# Patient Record
Sex: Female | Born: 1940
Health system: Southern US, Community
[De-identification: ages and names within clinical notes are randomized; demographics above are authoritative.]

## PROBLEM LIST (undated history)

## (undated) DIAGNOSIS — I10 Essential (primary) hypertension: Secondary | ICD-10-CM

## (undated) DIAGNOSIS — E785 Hyperlipidemia, unspecified: Secondary | ICD-10-CM

## (undated) HISTORY — DX: Hyperlipidemia, unspecified: E78.5

## (undated) HISTORY — PX: APPENDECTOMY: SHX54

## (undated) HISTORY — DX: Essential (primary) hypertension: I10

---

## 2002-03-20 ENCOUNTER — Ambulatory Visit (HOSPITAL_COMMUNITY): Admission: RE | Admit: 2002-03-20 | Discharge: 2002-03-20 | Payer: Self-pay | Admitting: Obstetrics and Gynecology

## 2002-03-20 ENCOUNTER — Encounter: Payer: Self-pay | Admitting: Obstetrics and Gynecology

## 2002-04-01 ENCOUNTER — Encounter: Payer: Self-pay | Admitting: Obstetrics and Gynecology

## 2002-04-01 ENCOUNTER — Ambulatory Visit (HOSPITAL_COMMUNITY): Admission: RE | Admit: 2002-04-01 | Discharge: 2002-04-01 | Payer: Self-pay | Admitting: Obstetrics and Gynecology

## 2003-04-15 ENCOUNTER — Encounter: Payer: Self-pay | Admitting: Obstetrics and Gynecology

## 2003-04-15 ENCOUNTER — Ambulatory Visit (HOSPITAL_COMMUNITY): Admission: RE | Admit: 2003-04-15 | Discharge: 2003-04-15 | Payer: Self-pay | Admitting: Obstetrics and Gynecology

## 2007-05-24 ENCOUNTER — Observation Stay (HOSPITAL_COMMUNITY): Admission: RE | Admit: 2007-05-24 | Discharge: 2007-05-25 | Payer: Self-pay | Admitting: Family Medicine

## 2007-05-24 ENCOUNTER — Encounter (INDEPENDENT_AMBULATORY_CARE_PROVIDER_SITE_OTHER): Payer: Self-pay | Admitting: General Surgery

## 2008-11-12 ENCOUNTER — Ambulatory Visit (HOSPITAL_COMMUNITY): Admission: RE | Admit: 2008-11-12 | Discharge: 2008-11-12 | Payer: Self-pay | Admitting: Obstetrics and Gynecology

## 2010-11-22 ENCOUNTER — Ambulatory Visit (HOSPITAL_COMMUNITY)
Admission: RE | Admit: 2010-11-22 | Discharge: 2010-11-22 | Payer: Self-pay | Source: Home / Self Care | Attending: Family Medicine | Admitting: Family Medicine

## 2011-04-19 NOTE — H&P (Signed)
NAMEJAILA, Tina Larson                ACCOUNT NO.:  [id number]   MEDICAL RECORD NO.:  [id number]          PATIENT TYPE:  OBV   LOCATION:  A311                          FACILITY:  [facility]   PHYSICIAN:  Angus G. Renard Matter, MD   DATE OF BIRTH:  26-Sep-1941   DATE OF ADMISSION:  05/24/2007  DATE OF DISCHARGE:  LH                              HISTORY & PHYSICAL   HISTORY OF PRESENT ILLNESS:  This 70 year old, white female was seen in  the office in the late afternoon with a history of lower abdominal pain  which had begun yesterday.  The patient has felt nauseated and anorexic  for most of the day today and has had continuous lower abdominal pain.  The patient was seen and examined and noted to have tenderness over the  right lower quadrant.  She had lab studies done.  A WBC was  11,900 with  hemoglobin 13.9, hematocrit 39.9, neutrophils 72, lymphocytes 19,  monocytes 8, eosinophils 1, absolute neutrophils 8.6.  Chemistries with  sodium 139, potassium 4.6, chloride 102, CO2 28, glucose 117, BUN 8,  creatinine 0.68.  GFR greater than 60.  The patient did have abdominal  CT scan done which showed evidence of acute appendicitis.  The patient  was admitted.   FAMILY HISTORY:  Noncontributory.   SOCIAL HISTORY:  The patient does not smoke or drink alcohol.   PAST MEDICAL/SURGICAL HISTORY:  The patient has had no prior surgery.  She did have two normal deliveries.  She has had no medical problems of  significance.   ALLERGIES:  No known drug allergies.   MEDICATIONS:  None.   REVIEW OF SYSTEMS:  HEENT:  Negative.  CARDIOPULMONARY:  No cough,  hemoptysis or dyspnea.  GI:  The patient has had nausea, but no  diarrhea.  She has had lower abdominal pain mainly in right lower  quadrant.   PHYSICAL EXAMINATION:  GENERAL:  Alert, but uncomfortable, white female.  HEENT:  Negative.  NECK:  Supple.  No JVD or thyroid abnormalities.  BREASTS:  Normal, no masses.  LUNGS:  Clear to P&A.  HEART:  Regular  rhythm no murmurs.  ABDOMEN:  The patient has tenderness with some slight rebound in right  lower quadrant.  No palpable organs or masses.  PELVIC:  Normal size uterus.  No adnexal masses.  External genitalia  normal.  EXTREMITIES:  Free of edema.  NEUROLOGIC:  No focal deficit.   IMPRESSION:  Abdominal pain secondary to acute appendicitis.   PLAN:  The plan is to keep the patient n.p.o. and obtain surgical  consult.  Continue IV fluids.  Will obtain chest x-ray and EKG.      Angus G. Renard Matter, MD  Electronically Signed     AGM/MEDQ  D:  05/24/2007  T:  05/25/2007  Job:  [id number]

## 2011-04-19 NOTE — Op Note (Signed)
[name], Tina Larson                ACCOUNT NO.:  [id number]   MEDICAL RECORD NO.:  [id number]          PATIENT TYPE:  OBV   LOCATION:  A311                          FACILITY:  [facility]   PHYSICIAN:  Dalia Heading, M.D.  DATE OF BIRTH:  [birth year]   DATE OF PROCEDURE:  05/24/2007  DATE OF DISCHARGE:                               OPERATIVE REPORT   PREOPERATIVE DIAGNOSIS:  Acute appendicitis.   POSTOPERATIVE DIAGNOSIS:  Acute appendicitis.   PROCEDURE:  Laparoscopic appendectomy.   SURGEON:  Dalia Heading, M.D.   ANESTHESIA:  General endotracheal.   INDICATIONS:  The patient is a 70 year old white female who presents  with right lower quadrant abdominal pain.  CT scan of the abdomen and  pelvis reveals acute appendicitis.  The risks and benefits of the  procedure including bleeding, infection, and the possibility of an open  procedure were fully explained to the patient who gave informed consent.   PROCEDURE NOTE:  The patient was placed in the supine position.  After  induction of general endotracheal anesthesia, the abdomen was prepped  and draped using the usual sterile technique with Betadine.  Surgical  site confirmation was performed.   A supraumbilical incision was made down to the fascia.  A Veress needle  was introduced into the abdominal cavity, and confirmation of placement  was done using the saline drop test.  The abdomen was then insufflated  to 16 mmHg pressure.  An 11-mm trocar was introduced into the abdominal  cavity under direct visualization with difficulty.  The patient was  placed in deeper Trendelenburg position, and an additional 12-mm trocar  was placed in the suprapubic region and a 5-mm trocar was placed in the  left lower quadrant region.  The appendix was visualized and noted to be  acutely inflamed.  The mesoappendix was divided using the harmonic  scalpel.  A vascular Endo-GIA was placed across the base of the appendix  and fired.  The appendix  was removed using an EndoCatch bag.  The right  lower quadrant was inspected.  No abnormal bleeding or spillage was  noted.  The staple line was noted to be intact.  All fluid and air were  then evacuated from the abdominal cavity prior to removal of the  trocars.   All wounds were irrigated with normal saline.  All wounds were injected  with 0.5% Sensorcaine.  The supraumbilical fascia as well as suprapubic  fascia were reapproximated using 0 Vicryl interrupted sutures.  All skin  incisions were closed using staples.  Betadine ointment and dry sterile  dressings were applied.   All tape and needle counts were correct at the end of the procedure.  The patient was extubated in the operating room and went back to the  recovery room awake and in stable condition.   COMPLICATIONS:  None.   SPECIMEN:  Appendix.   BLOOD LOSS:  Minimal.      Dalia Heading, M.D.  Electronically Signed     MAJ/MEDQ  D:  05/24/2007  T:  05/25/2007  Job:  [id number]   cc:  Angus G. Renard Matter, MD  Fax: [phone]

## 2011-09-21 LAB — BASIC METABOLIC PANEL
BUN: 8
CO2: 28
Chloride: 102
Creatinine, Ser: 0.68
Potassium: 4.6

## 2011-09-21 LAB — DIFFERENTIAL
Basophils Relative: 0
Eosinophils Absolute: 0.1
Eosinophils Relative: 1
Monocytes Relative: 8
Neutrophils Relative %: 72

## 2011-09-21 LAB — CBC
HCT: 39.9
MCHC: 34.9
MCV: 91.1
Platelets: 238
RBC: 4.38

## 2013-11-29 ENCOUNTER — Emergency Department (HOSPITAL_COMMUNITY): Payer: Medicare Other

## 2013-11-29 ENCOUNTER — Emergency Department (HOSPITAL_COMMUNITY)
Admission: EM | Admit: 2013-11-29 | Discharge: 2013-11-29 | Disposition: A | Discharge status: Home / Self Care | Payer: Medicare Other | Attending: Emergency Medicine | Admitting: Emergency Medicine

## 2013-11-29 ENCOUNTER — Encounter (HOSPITAL_COMMUNITY): Payer: Self-pay | Admitting: Emergency Medicine

## 2013-11-29 DIAGNOSIS — W010XXA Fall on same level from slipping, tripping and stumbling without subsequent striking against object, initial encounter: Secondary | ICD-10-CM | POA: Insufficient documentation

## 2013-11-29 DIAGNOSIS — S82899A Other fracture of unspecified lower leg, initial encounter for closed fracture: Secondary | ICD-10-CM | POA: Insufficient documentation

## 2013-11-29 DIAGNOSIS — Y939 Activity, unspecified: Secondary | ICD-10-CM | POA: Insufficient documentation

## 2013-11-29 DIAGNOSIS — Y929 Unspecified place or not applicable: Secondary | ICD-10-CM | POA: Insufficient documentation

## 2013-11-29 DIAGNOSIS — S82401A Unspecified fracture of shaft of right fibula, initial encounter for closed fracture: Secondary | ICD-10-CM

## 2013-11-29 MED ORDER — HYDROCODONE-ACETAMINOPHEN 5-325 MG PO TABS: 1.0000 | ORAL_TABLET | Freq: Four times a day (QID) | ORAL | Status: DC | PRN

## 2013-11-29 NOTE — ED Notes (Signed)
Left foot slipped but right foot slide under pt per pt on 12/24 night, able to put weight on it, using a cane

## 2013-11-29 NOTE — ED Provider Notes (Signed)
CSN: [id number]     Arrival date & time 11/29/13  1632 History   First MD Initiated Contact with Patient 11/29/13 1723     Chief Complaint  Patient presents with  . Ankle Pain   (Consider location/radiation/quality/duration/timing/severity/associated sxs/prior Treatment) HPI Comments: Patient presents to the emergency department with chief complaint of right ankle pain. She states that she suffered a mechanical fall on Christmas Eve. She denies hitting her head or losing consciousness. She states that she slipped on wet ground. She states that after she fell, she noticed pain in her right ankle. She states that it has swollen. She tried taking some Tylenol with some relief. She is tried using a walker, and is able to ambulate with assistance. The pain is mild. She describes the pain as a burning sensation on the lateral side of her right ankle.  The history is provided by the patient. No language interpreter was used.    History reviewed. No pertinent past medical history. Past Surgical History  Procedure Laterality Date  . Appendectomy     History reviewed. No pertinent family history. History  Substance Use Topics  . Smoking status: Never Smoker   . Smokeless tobacco: Not on file  . Alcohol Use: No   OB History   Grav Para Term Preterm Abortions TAB SAB Ect Mult Living                 Review of Systems  All other systems reviewed and are negative.    Allergies  Review of patient's allergies indicates no known allergies.  Home Medications   Current Outpatient Rx  Name  Route  Sig  Dispense  Refill  . HYDROcodone-acetaminophen (NORCO/VICODIN) 5-325 MG per tablet   Oral   Take 1 tablet by mouth every 6 (six) hours as needed.   15 tablet   0    BP 157/51  Pulse 84  Temp(Src) 97.6 F (36.4 C) (Oral)  Resp 18  Ht 5\' 4"  (1.626 m)  Wt 185 lb (83.915 kg)  BMI 31.74 kg/m2  SpO2 100% Physical Exam  Nursing note and vitals reviewed. Constitutional: She is oriented  to person, place, and time. She appears well-developed and well-nourished.  HENT:  Head: Normocephalic and atraumatic.  Eyes: Conjunctivae and EOM are normal.  Neck: Normal range of motion.  Cardiovascular: Normal rate.   Intact distal pulses  Pulmonary/Chest: Effort normal.  Abdominal: She exhibits no distension.  Musculoskeletal: Normal range of motion.  Right ankle range of motion and strength reduced secondary to pain, it is specifically worsened with plantar flexion, no obvious bony abnormality or deformity, there is a moderate amount of swelling  Neurological: She is alert and oriented to person, place, and time.  Sensation intact  Skin: Skin is dry.  Brisk capillary refill  Psychiatric: She has a normal mood and affect. Her behavior is normal. Judgment and thought content normal.    ED Course  Procedures (including critical care time) Labs Review Labs Reviewed - No data to display Imaging Review Dg Ankle Complete Right  11/29/2013   CLINICAL DATA:  Status post fall.  Right ankle pain and swelling.  EXAM: RIGHT ANKLE - COMPLETE 3+ VIEW  COMPARISON:  None.  FINDINGS: The patient has a nondisplaced fracture of the distal fibula with associated soft tissue swelling. No other acute bony or joint abnormality is identified. Plantar calcaneal spur is noted.  IMPRESSION: Nondisplaced distal fibular fracture.   Electronically Signed   By: Drusilla Kanner M.D.  On: 11/29/2013 17:06    EKG Interpretation   None       MDM   1. Fibula fracture, right, closed, initial encounter    Patient was fibular fracture. It is nondisplaced. She has been weightbearing. Will give the patient a Cam Walker. Recommend orthopedic followup. Will also give some crutches, and pain medicine. Return precautions are given. Rice therapy is advised. Patient understands and agrees with plan. She is stable and ready for discharge.    Roxy Horseman, PA-C 11/29/13 1742

## 2013-11-29 NOTE — ED Provider Notes (Signed)
Medical screening examination/treatment/procedure(s) were performed by non-physician practitioner and as supervising physician I was immediately available for consultation/collaboration.  EKG Interpretation   None         Vaanya Shambaugh L Sofie Schendel, MD 11/29/13 2016 

## 2013-12-03 ENCOUNTER — Encounter: Payer: Self-pay | Admitting: Orthopedic Surgery

## 2013-12-03 ENCOUNTER — Ambulatory Visit (INDEPENDENT_AMBULATORY_CARE_PROVIDER_SITE_OTHER): Payer: Medicare Other | Admitting: Orthopedic Surgery

## 2013-12-03 VITALS — BP 163/88 | Ht 64.0 in | Wt 185.0 lb

## 2013-12-03 DIAGNOSIS — S82891A Other fracture of right lower leg, initial encounter for closed fracture: Secondary | ICD-10-CM

## 2013-12-03 DIAGNOSIS — S82899A Other fracture of unspecified lower leg, initial encounter for closed fracture: Secondary | ICD-10-CM | POA: Insufficient documentation

## 2013-12-03 NOTE — Patient Instructions (Signed)
Do ankle exercises

## 2013-12-03 NOTE — Progress Notes (Signed)
   Subjective:    Patient ID: Tina Larson, female    DOB: [birth year], 72 y.o.   MRN: [id number] Chief Complaint  Patient presents with  . Foot Pain    Right Fibula fracture d/t injury 11/27/13    Foot Pain   72 year old female presents with history of fall on December 24 injuring her right fibula she complains of pain along the lateral aspect of the right ankle with swelling and stiffness and ecchymosis under the skin, painful weightbearing  Review of systems as recorded    Review of Systems She reported document reviewed today on December 30th    Objective:   Physical Exam BP 163/88  Ht 5\' 4"  (1.626 m)  Wt 185 lb (83.915 kg)  BMI 31.74 kg/m2 General appearance is normal, the patient is alert and oriented x3 with normal mood and affect. Ambulation is limited by Cam Walker and crutches, not doing well with the crutches  Tenderness over the fibula skin is ecchymotic motor exam normal muscle tone ankle is stable range of motion painful limited but tolerable to 90 of ankle position pulses intact sensation is normal. There is no lymphadenopathy.       Assessment & Plan:  Fibular fracture nondisplaced ankle mortise intact on x-ray  Wear brace for 8 weeks  Returns 7 weeks for x-ray

## 2014-01-21 ENCOUNTER — Ambulatory Visit: Payer: Medicare Other | Admitting: Orthopedic Surgery

## 2014-01-23 ENCOUNTER — Ambulatory Visit (INDEPENDENT_AMBULATORY_CARE_PROVIDER_SITE_OTHER): Payer: Medicare Other | Admitting: Orthopedic Surgery

## 2014-01-23 ENCOUNTER — Ambulatory Visit (INDEPENDENT_AMBULATORY_CARE_PROVIDER_SITE_OTHER): Payer: Medicare Other

## 2014-01-23 ENCOUNTER — Encounter: Payer: Self-pay | Admitting: Orthopedic Surgery

## 2014-01-23 VITALS — BP 155/80 | Ht 64.0 in | Wt 185.0 lb

## 2014-01-23 DIAGNOSIS — S82899A Other fracture of unspecified lower leg, initial encounter for closed fracture: Secondary | ICD-10-CM

## 2014-01-23 DIAGNOSIS — S82891A Other fracture of right lower leg, initial encounter for closed fracture: Secondary | ICD-10-CM

## 2014-01-23 NOTE — Progress Notes (Signed)
Patient ID: Tina Larson, female   DOB: Apr 08, 1941, 73 y.o.   MRN: [id number]  Encounter Diagnosis  Name Primary?  Marland Kitchen. Ankle fracture, right Yes    Chief Complaint  Patient presents with  . Follow-up    7 week recheck right ankle FIB fracture DOI 11/27/13    BP 155/80  Ht 5\' 4"  (1.626 m)  Wt 185 lb (83.915 kg)  BMI 31.74 kg/m2  Status post Cam Walker for fibular fracture she is now 8 weeks out. She complains of swelling and some burning along the fracture and fibula  The ankle looks good her range of motion is actually excellent she has minimal tenderness at the fracture site and the x-ray shows that the fracture is essentially healed  Recommend progressive weaning from the brace progressive increase in activity as tolerated followup as needed

## 2014-06-23 ENCOUNTER — Other Ambulatory Visit (HOSPITAL_COMMUNITY): Payer: Self-pay | Admitting: Family Medicine

## 2014-06-23 DIAGNOSIS — M810 Age-related osteoporosis without current pathological fracture: Secondary | ICD-10-CM

## 2014-06-25 ENCOUNTER — Ambulatory Visit (HOSPITAL_COMMUNITY)
Admission: RE | Admit: 2014-06-25 | Discharge: 2014-06-25 | Disposition: A | Discharge status: Home / Self Care | Payer: Medicare Other | Source: Ambulatory Visit | Attending: Family Medicine | Admitting: Family Medicine

## 2014-06-25 DIAGNOSIS — M818 Other osteoporosis without current pathological fracture: Secondary | ICD-10-CM | POA: Insufficient documentation

## 2014-06-25 DIAGNOSIS — M810 Age-related osteoporosis without current pathological fracture: Secondary | ICD-10-CM

## 2016-02-18 ENCOUNTER — Emergency Department (HOSPITAL_COMMUNITY): Payer: Medicare Other

## 2016-02-18 ENCOUNTER — Emergency Department (HOSPITAL_COMMUNITY)
Admission: EM | Admit: 2016-02-18 | Discharge: 2016-02-18 | Disposition: A | Discharge status: Home / Self Care | Payer: Medicare Other | Attending: Emergency Medicine | Admitting: Emergency Medicine

## 2016-02-18 ENCOUNTER — Encounter (HOSPITAL_COMMUNITY): Payer: Self-pay | Admitting: Emergency Medicine

## 2016-02-18 DIAGNOSIS — R6889 Other general symptoms and signs: Secondary | ICD-10-CM

## 2016-02-18 DIAGNOSIS — J111 Influenza due to unidentified influenza virus with other respiratory manifestations: Secondary | ICD-10-CM | POA: Diagnosis not present

## 2016-02-18 DIAGNOSIS — R05 Cough: Secondary | ICD-10-CM | POA: Diagnosis present

## 2016-02-18 NOTE — ED Notes (Signed)
Pt states she has had a bad cough, generalized weakness, and headache since last Thursday.

## 2016-02-18 NOTE — Discharge Instructions (Signed)
Continue the cough medicine that the pharmacist gave you. Return for any new or worse symptoms. Symptoms seem to be consistent with a flulike illness. Chest x-ray was negative for pneumonia.

## 2016-02-18 NOTE — ED Provider Notes (Signed)
CSN: [id number]     Arrival date & time 02/18/16  1553 History   First MD Initiated Contact with Patient 02/18/16 1705     Chief Complaint  Patient presents with  . Cough     (Consider location/radiation/quality/duration/timing/severity/associated sxs/prior Treatment) Patient is a 75 y.o. female presenting with cough. The history is provided by the patient.  Cough Associated symptoms: chills, fever, headaches and myalgias   Associated symptoms: no chest pain and no rash    patient with onset of flulike symptoms one week ago. Associated with fever chills cough is nonproductive bodyaches headache yesterday that resolved. Fatigue generalized weakness. Patient's been taking cough medicine prescribed by her pharmacist. Her husband has a similar illness that started a few days later. Overall patient feels better the last couple days.  History reviewed. No pertinent past medical history. Past Surgical History  Procedure Laterality Date  . Appendectomy     History reviewed. No pertinent family history. Social History  Substance Use Topics  . Smoking status: Never Smoker   . Smokeless tobacco: None  . Alcohol Use: No   OB History    No data available     Review of Systems  Constitutional: Positive for fever and chills.  HENT: Positive for congestion.   Eyes: Negative for visual disturbance.  Respiratory: Positive for cough.   Cardiovascular: Negative for chest pain.  Gastrointestinal: Negative for nausea, vomiting, abdominal pain and diarrhea.  Genitourinary: Negative for dysuria.  Musculoskeletal: Positive for myalgias.  Skin: Negative for rash.  Neurological: Positive for headaches.  Hematological: Does not bruise/bleed easily.  Psychiatric/Behavioral: Negative for confusion.      Allergies  Review of patient's allergies indicates no known allergies.  Home Medications   Prior to Admission medications   Medication Sig Start Date End Date Taking? Authorizing Provider   HYDROcodone-acetaminophen (NORCO/VICODIN) 5-325 MG per tablet Take 1 tablet by mouth every 6 (six) hours as needed. 11/29/13   Roxy Horsemanobert Browning, PA-C   BP 180/62 mmHg  Pulse 66  Temp(Src) 98.5 F (36.9 C) (Temporal)  Resp 18  Ht 5\' 4"  (1.626 m)  Wt 81.647 kg  BMI 30.88 kg/m2  SpO2 100% Physical Exam  Constitutional: She is oriented to person, place, and time. She appears well-developed and well-nourished. No distress.  HENT:  Head: Normocephalic and atraumatic.  Mouth/Throat: Oropharynx is clear and moist.  Eyes: Conjunctivae and EOM are normal. Pupils are equal, round, and reactive to light.  Neck: Normal range of motion. Neck supple.  Cardiovascular: Normal rate, regular rhythm and normal heart sounds.   No murmur heard. Pulmonary/Chest: Effort normal and breath sounds normal. No respiratory distress. She has no wheezes. She has no rales.  Abdominal: Soft. There is no tenderness.  Musculoskeletal: Normal range of motion. She exhibits no edema.  Neurological: She is alert and oriented to person, place, and time. No cranial nerve deficit. She exhibits normal muscle tone. Coordination normal.  Skin: Skin is warm. No rash noted.  Nursing note and vitals reviewed.   ED Course  Procedures (including critical care time) Labs Review Labs Reviewed - No data to display  Imaging Review Dg Chest 2 View  02/18/2016  CLINICAL DATA:  Dry cough and fever since Thursday, nonsmoker EXAM: CHEST  2 VIEW COMPARISON:  05/24/2007 FINDINGS: Mild enlargement of cardiac silhouette. Atherosclerotic calcification aorta. Mediastinal contours and pulmonary vascularity normal. Bronchitic changes without infiltrate, pleural effusion, or pneumothorax. Bones appear diffusely demineralized. IMPRESSION: Mild enlargement of cardiac silhouette. Bronchitic changes without infiltrate. Electronically Signed  By: Ulyses Southward M.D.   On: 02/18/2016 16:26   I have personally reviewed and evaluated these images and lab  results as part of my medical decision-making.   EKG Interpretation None      MDM   Final diagnoses:  Flu-like symptoms    Patient with one-week history of flulike illness. Patient overall improving. Still with cough nonproductive. Still with fatigue. Headache yesterday but resolved today. Patient's husband with similar illness that started a few days later for him. Patient is taking cough medicine provided by the pharmacist. It is helping with the cough at night. Patient nontoxic no acute distress. Chest x-ray negative for pneumonia.  Oxygen saturation on room air is 100%.  Vanetta Mulders, MD 02/18/16 1753

## 2016-10-03 ENCOUNTER — Other Ambulatory Visit (HOSPITAL_COMMUNITY): Payer: Self-pay | Admitting: Internal Medicine

## 2016-10-03 DIAGNOSIS — Z780 Asymptomatic menopausal state: Secondary | ICD-10-CM

## 2016-10-12 ENCOUNTER — Other Ambulatory Visit (HOSPITAL_COMMUNITY): Payer: Self-pay | Admitting: Internal Medicine

## 2016-10-12 DIAGNOSIS — Z1231 Encounter for screening mammogram for malignant neoplasm of breast: Secondary | ICD-10-CM

## 2016-10-19 ENCOUNTER — Other Ambulatory Visit (HOSPITAL_COMMUNITY): Payer: Medicare Other

## 2016-11-09 ENCOUNTER — Ambulatory Visit (HOSPITAL_COMMUNITY)
Admission: RE | Admit: 2016-11-09 | Discharge: 2016-11-09 | Disposition: A | Discharge status: Home / Self Care | Payer: Medicare Other | Source: Ambulatory Visit | Attending: Internal Medicine | Admitting: Internal Medicine

## 2016-11-09 DIAGNOSIS — M85851 Other specified disorders of bone density and structure, right thigh: Secondary | ICD-10-CM | POA: Insufficient documentation

## 2016-11-09 DIAGNOSIS — Z780 Asymptomatic menopausal state: Secondary | ICD-10-CM | POA: Insufficient documentation

## 2016-11-09 DIAGNOSIS — Z1231 Encounter for screening mammogram for malignant neoplasm of breast: Secondary | ICD-10-CM

## 2016-11-09 DIAGNOSIS — M8588 Other specified disorders of bone density and structure, other site: Secondary | ICD-10-CM | POA: Insufficient documentation

## 2016-12-07 DIAGNOSIS — Z0000 Encounter for general adult medical examination without abnormal findings: Secondary | ICD-10-CM | POA: Diagnosis not present

## 2016-12-07 DIAGNOSIS — Z23 Encounter for immunization: Secondary | ICD-10-CM | POA: Diagnosis not present

## 2017-02-02 DIAGNOSIS — R7301 Impaired fasting glucose: Secondary | ICD-10-CM | POA: Diagnosis not present

## 2017-02-02 DIAGNOSIS — E782 Mixed hyperlipidemia: Secondary | ICD-10-CM | POA: Diagnosis not present

## 2017-02-06 DIAGNOSIS — Z0000 Encounter for general adult medical examination without abnormal findings: Secondary | ICD-10-CM | POA: Diagnosis not present

## 2017-02-06 DIAGNOSIS — E782 Mixed hyperlipidemia: Secondary | ICD-10-CM | POA: Diagnosis not present

## 2017-02-06 DIAGNOSIS — Z23 Encounter for immunization: Secondary | ICD-10-CM | POA: Diagnosis not present

## 2017-02-06 DIAGNOSIS — R7301 Impaired fasting glucose: Secondary | ICD-10-CM | POA: Diagnosis not present

## 2017-04-18 DIAGNOSIS — R079 Chest pain, unspecified: Secondary | ICD-10-CM | POA: Diagnosis not present

## 2017-04-18 DIAGNOSIS — R42 Dizziness and giddiness: Secondary | ICD-10-CM | POA: Diagnosis not present

## 2017-04-26 DIAGNOSIS — R42 Dizziness and giddiness: Secondary | ICD-10-CM | POA: Diagnosis not present

## 2017-04-26 DIAGNOSIS — R079 Chest pain, unspecified: Secondary | ICD-10-CM | POA: Diagnosis not present

## 2017-06-14 DIAGNOSIS — E782 Mixed hyperlipidemia: Secondary | ICD-10-CM | POA: Diagnosis not present

## 2017-06-14 DIAGNOSIS — R7301 Impaired fasting glucose: Secondary | ICD-10-CM | POA: Diagnosis not present

## 2017-06-19 DIAGNOSIS — E782 Mixed hyperlipidemia: Secondary | ICD-10-CM | POA: Diagnosis not present

## 2017-06-19 DIAGNOSIS — I10 Essential (primary) hypertension: Secondary | ICD-10-CM | POA: Diagnosis not present

## 2017-06-19 DIAGNOSIS — R7301 Impaired fasting glucose: Secondary | ICD-10-CM | POA: Diagnosis not present

## 2017-10-11 DIAGNOSIS — Z23 Encounter for immunization: Secondary | ICD-10-CM | POA: Diagnosis not present

## 2017-12-18 DIAGNOSIS — I10 Essential (primary) hypertension: Secondary | ICD-10-CM | POA: Diagnosis not present

## 2017-12-18 DIAGNOSIS — Z23 Encounter for immunization: Secondary | ICD-10-CM | POA: Diagnosis not present

## 2017-12-27 DIAGNOSIS — R7301 Impaired fasting glucose: Secondary | ICD-10-CM | POA: Diagnosis not present

## 2018-05-07 DIAGNOSIS — H5211 Myopia, right eye: Secondary | ICD-10-CM | POA: Diagnosis not present

## 2018-06-14 DIAGNOSIS — E782 Mixed hyperlipidemia: Secondary | ICD-10-CM | POA: Diagnosis not present

## 2018-06-14 DIAGNOSIS — Z23 Encounter for immunization: Secondary | ICD-10-CM | POA: Diagnosis not present

## 2018-06-14 DIAGNOSIS — I10 Essential (primary) hypertension: Secondary | ICD-10-CM | POA: Diagnosis not present

## 2018-06-14 DIAGNOSIS — R7301 Impaired fasting glucose: Secondary | ICD-10-CM | POA: Diagnosis not present

## 2018-06-20 ENCOUNTER — Other Ambulatory Visit: Payer: Self-pay | Admitting: Internal Medicine

## 2018-06-20 DIAGNOSIS — E1169 Type 2 diabetes mellitus with other specified complication: Secondary | ICD-10-CM | POA: Diagnosis not present

## 2018-06-20 DIAGNOSIS — Z0000 Encounter for general adult medical examination without abnormal findings: Secondary | ICD-10-CM | POA: Diagnosis not present

## 2018-06-20 DIAGNOSIS — H6120 Impacted cerumen, unspecified ear: Secondary | ICD-10-CM | POA: Diagnosis not present

## 2018-06-20 DIAGNOSIS — Z780 Asymptomatic menopausal state: Secondary | ICD-10-CM

## 2018-06-20 DIAGNOSIS — E782 Mixed hyperlipidemia: Secondary | ICD-10-CM | POA: Diagnosis not present

## 2018-06-20 DIAGNOSIS — I10 Essential (primary) hypertension: Secondary | ICD-10-CM | POA: Diagnosis not present

## 2018-07-16 ENCOUNTER — Other Ambulatory Visit (HOSPITAL_COMMUNITY): Payer: Self-pay | Admitting: Adult Health Nurse Practitioner

## 2018-07-16 ENCOUNTER — Ambulatory Visit (HOSPITAL_COMMUNITY)
Admission: RE | Admit: 2018-07-16 | Discharge: 2018-07-16 | Disposition: A | Discharge status: Home / Self Care | Payer: Medicare Other | Source: Ambulatory Visit | Attending: Adult Health Nurse Practitioner | Admitting: Adult Health Nurse Practitioner

## 2018-07-16 DIAGNOSIS — W19XXXA Unspecified fall, initial encounter: Secondary | ICD-10-CM | POA: Diagnosis not present

## 2018-07-16 DIAGNOSIS — M25571 Pain in right ankle and joints of right foot: Secondary | ICD-10-CM

## 2018-07-16 DIAGNOSIS — S9001XA Contusion of right ankle, initial encounter: Secondary | ICD-10-CM | POA: Diagnosis not present

## 2018-08-29 DIAGNOSIS — T7840XA Allergy, unspecified, initial encounter: Secondary | ICD-10-CM | POA: Diagnosis not present

## 2018-08-29 DIAGNOSIS — R220 Localized swelling, mass and lump, head: Secondary | ICD-10-CM | POA: Diagnosis not present

## 2018-08-29 DIAGNOSIS — L500 Allergic urticaria: Secondary | ICD-10-CM | POA: Diagnosis not present

## 2018-08-29 DIAGNOSIS — R0602 Shortness of breath: Secondary | ICD-10-CM | POA: Diagnosis not present

## 2018-08-29 DIAGNOSIS — R9431 Abnormal electrocardiogram [ECG] [EKG]: Secondary | ICD-10-CM | POA: Diagnosis not present

## 2018-09-01 DIAGNOSIS — L232 Allergic contact dermatitis due to cosmetics: Secondary | ICD-10-CM | POA: Diagnosis not present

## 2018-09-01 DIAGNOSIS — Z91013 Allergy to seafood: Secondary | ICD-10-CM | POA: Diagnosis not present

## 2018-09-25 DIAGNOSIS — R51 Headache: Secondary | ICD-10-CM | POA: Diagnosis not present

## 2018-09-25 DIAGNOSIS — I10 Essential (primary) hypertension: Secondary | ICD-10-CM | POA: Diagnosis not present

## 2018-09-25 DIAGNOSIS — Z882 Allergy status to sulfonamides status: Secondary | ICD-10-CM | POA: Diagnosis not present

## 2018-09-25 DIAGNOSIS — W01198A Fall on same level from slipping, tripping and stumbling with subsequent striking against other object, initial encounter: Secondary | ICD-10-CM | POA: Diagnosis not present

## 2018-09-25 DIAGNOSIS — Z79899 Other long term (current) drug therapy: Secondary | ICD-10-CM | POA: Diagnosis not present

## 2018-09-25 DIAGNOSIS — S060X0A Concussion without loss of consciousness, initial encounter: Secondary | ICD-10-CM | POA: Diagnosis not present

## 2018-09-25 DIAGNOSIS — S0003XA Contusion of scalp, initial encounter: Secondary | ICD-10-CM | POA: Diagnosis not present

## 2019-01-21 DIAGNOSIS — R7301 Impaired fasting glucose: Secondary | ICD-10-CM | POA: Diagnosis not present

## 2019-01-21 DIAGNOSIS — I10 Essential (primary) hypertension: Secondary | ICD-10-CM | POA: Diagnosis not present

## 2019-01-21 DIAGNOSIS — E1169 Type 2 diabetes mellitus with other specified complication: Secondary | ICD-10-CM | POA: Diagnosis not present

## 2019-01-21 DIAGNOSIS — E782 Mixed hyperlipidemia: Secondary | ICD-10-CM | POA: Diagnosis not present

## 2019-01-23 DIAGNOSIS — M25511 Pain in right shoulder: Secondary | ICD-10-CM | POA: Diagnosis not present

## 2019-01-23 DIAGNOSIS — E1169 Type 2 diabetes mellitus with other specified complication: Secondary | ICD-10-CM | POA: Diagnosis not present

## 2019-01-23 DIAGNOSIS — I10 Essential (primary) hypertension: Secondary | ICD-10-CM | POA: Diagnosis not present

## 2019-01-23 DIAGNOSIS — E782 Mixed hyperlipidemia: Secondary | ICD-10-CM | POA: Diagnosis not present

## 2019-02-12 DIAGNOSIS — R7301 Impaired fasting glucose: Secondary | ICD-10-CM | POA: Diagnosis not present

## 2019-02-12 DIAGNOSIS — E782 Mixed hyperlipidemia: Secondary | ICD-10-CM | POA: Diagnosis not present

## 2019-02-12 DIAGNOSIS — I10 Essential (primary) hypertension: Secondary | ICD-10-CM | POA: Diagnosis not present

## 2019-03-11 DIAGNOSIS — R7301 Impaired fasting glucose: Secondary | ICD-10-CM | POA: Diagnosis not present

## 2019-03-11 DIAGNOSIS — I10 Essential (primary) hypertension: Secondary | ICD-10-CM | POA: Diagnosis not present

## 2019-03-11 DIAGNOSIS — E782 Mixed hyperlipidemia: Secondary | ICD-10-CM | POA: Diagnosis not present

## 2019-04-08 DIAGNOSIS — Z0000 Encounter for general adult medical examination without abnormal findings: Secondary | ICD-10-CM | POA: Diagnosis not present

## 2019-05-14 DIAGNOSIS — R7301 Impaired fasting glucose: Secondary | ICD-10-CM | POA: Diagnosis not present

## 2019-05-14 DIAGNOSIS — E782 Mixed hyperlipidemia: Secondary | ICD-10-CM | POA: Diagnosis not present

## 2019-05-14 DIAGNOSIS — I10 Essential (primary) hypertension: Secondary | ICD-10-CM | POA: Diagnosis not present

## 2019-07-12 DIAGNOSIS — R7301 Impaired fasting glucose: Secondary | ICD-10-CM | POA: Diagnosis not present

## 2019-07-12 DIAGNOSIS — I10 Essential (primary) hypertension: Secondary | ICD-10-CM | POA: Diagnosis not present

## 2019-07-12 DIAGNOSIS — E782 Mixed hyperlipidemia: Secondary | ICD-10-CM | POA: Diagnosis not present

## 2019-07-22 DIAGNOSIS — E1169 Type 2 diabetes mellitus with other specified complication: Secondary | ICD-10-CM | POA: Diagnosis not present

## 2019-07-22 DIAGNOSIS — E782 Mixed hyperlipidemia: Secondary | ICD-10-CM | POA: Diagnosis not present

## 2019-07-22 DIAGNOSIS — I10 Essential (primary) hypertension: Secondary | ICD-10-CM | POA: Diagnosis not present

## 2019-07-22 DIAGNOSIS — R7301 Impaired fasting glucose: Secondary | ICD-10-CM | POA: Diagnosis not present

## 2019-07-24 DIAGNOSIS — E782 Mixed hyperlipidemia: Secondary | ICD-10-CM | POA: Diagnosis not present

## 2019-07-24 DIAGNOSIS — I10 Essential (primary) hypertension: Secondary | ICD-10-CM | POA: Diagnosis not present

## 2019-07-24 DIAGNOSIS — E1169 Type 2 diabetes mellitus with other specified complication: Secondary | ICD-10-CM | POA: Diagnosis not present

## 2019-08-09 DIAGNOSIS — R7301 Impaired fasting glucose: Secondary | ICD-10-CM | POA: Diagnosis not present

## 2019-08-09 DIAGNOSIS — I10 Essential (primary) hypertension: Secondary | ICD-10-CM | POA: Diagnosis not present

## 2019-08-09 DIAGNOSIS — E782 Mixed hyperlipidemia: Secondary | ICD-10-CM | POA: Diagnosis not present

## 2019-08-22 ENCOUNTER — Other Ambulatory Visit (HOSPITAL_COMMUNITY): Payer: Self-pay | Admitting: Internal Medicine

## 2019-08-22 DIAGNOSIS — Z1231 Encounter for screening mammogram for malignant neoplasm of breast: Secondary | ICD-10-CM

## 2019-09-12 DIAGNOSIS — E782 Mixed hyperlipidemia: Secondary | ICD-10-CM | POA: Diagnosis not present

## 2019-09-12 DIAGNOSIS — I10 Essential (primary) hypertension: Secondary | ICD-10-CM | POA: Diagnosis not present

## 2019-09-12 DIAGNOSIS — R7301 Impaired fasting glucose: Secondary | ICD-10-CM | POA: Diagnosis not present

## 2019-09-18 DIAGNOSIS — W01198A Fall on same level from slipping, tripping and stumbling with subsequent striking against other object, initial encounter: Secondary | ICD-10-CM | POA: Diagnosis not present

## 2019-09-18 DIAGNOSIS — S0993XA Unspecified injury of face, initial encounter: Secondary | ICD-10-CM | POA: Diagnosis not present

## 2019-09-18 DIAGNOSIS — S199XXA Unspecified injury of neck, initial encounter: Secondary | ICD-10-CM | POA: Diagnosis not present

## 2019-09-18 DIAGNOSIS — R001 Bradycardia, unspecified: Secondary | ICD-10-CM | POA: Diagnosis not present

## 2019-09-18 DIAGNOSIS — S0990XA Unspecified injury of head, initial encounter: Secondary | ICD-10-CM | POA: Diagnosis not present

## 2019-09-18 DIAGNOSIS — Z91013 Allergy to seafood: Secondary | ICD-10-CM | POA: Diagnosis not present

## 2019-09-18 DIAGNOSIS — M79645 Pain in left finger(s): Secondary | ICD-10-CM | POA: Diagnosis not present

## 2019-09-18 DIAGNOSIS — I10 Essential (primary) hypertension: Secondary | ICD-10-CM | POA: Diagnosis not present

## 2019-09-18 DIAGNOSIS — R42 Dizziness and giddiness: Secondary | ICD-10-CM | POA: Diagnosis not present

## 2019-09-18 DIAGNOSIS — S6992XA Unspecified injury of left wrist, hand and finger(s), initial encounter: Secondary | ICD-10-CM | POA: Diagnosis not present

## 2019-09-18 DIAGNOSIS — R55 Syncope and collapse: Secondary | ICD-10-CM | POA: Diagnosis not present

## 2019-09-18 DIAGNOSIS — S299XXA Unspecified injury of thorax, initial encounter: Secondary | ICD-10-CM | POA: Diagnosis not present

## 2019-09-18 DIAGNOSIS — R6884 Jaw pain: Secondary | ICD-10-CM | POA: Diagnosis not present

## 2019-09-18 DIAGNOSIS — Z79899 Other long term (current) drug therapy: Secondary | ICD-10-CM | POA: Diagnosis not present

## 2019-09-18 DIAGNOSIS — J320 Chronic maxillary sinusitis: Secondary | ICD-10-CM | POA: Diagnosis not present

## 2019-09-24 DIAGNOSIS — R42 Dizziness and giddiness: Secondary | ICD-10-CM | POA: Diagnosis not present

## 2019-09-24 DIAGNOSIS — I10 Essential (primary) hypertension: Secondary | ICD-10-CM | POA: Diagnosis not present

## 2019-09-24 DIAGNOSIS — H8110 Benign paroxysmal vertigo, unspecified ear: Secondary | ICD-10-CM | POA: Diagnosis not present

## 2019-10-01 DIAGNOSIS — H81399 Other peripheral vertigo, unspecified ear: Secondary | ICD-10-CM | POA: Diagnosis not present

## 2019-10-01 DIAGNOSIS — I10 Essential (primary) hypertension: Secondary | ICD-10-CM | POA: Diagnosis not present

## 2019-10-01 DIAGNOSIS — R2681 Unsteadiness on feet: Secondary | ICD-10-CM | POA: Diagnosis not present

## 2019-10-01 DIAGNOSIS — R296 Repeated falls: Secondary | ICD-10-CM | POA: Diagnosis not present

## 2019-10-01 DIAGNOSIS — M6281 Muscle weakness (generalized): Secondary | ICD-10-CM | POA: Diagnosis not present

## 2019-10-02 DIAGNOSIS — I10 Essential (primary) hypertension: Secondary | ICD-10-CM | POA: Diagnosis not present

## 2019-10-02 DIAGNOSIS — H8110 Benign paroxysmal vertigo, unspecified ear: Secondary | ICD-10-CM | POA: Diagnosis not present

## 2019-10-03 DIAGNOSIS — R2681 Unsteadiness on feet: Secondary | ICD-10-CM | POA: Diagnosis not present

## 2019-10-03 DIAGNOSIS — H81399 Other peripheral vertigo, unspecified ear: Secondary | ICD-10-CM | POA: Diagnosis not present

## 2019-10-03 DIAGNOSIS — M6281 Muscle weakness (generalized): Secondary | ICD-10-CM | POA: Diagnosis not present

## 2019-10-03 DIAGNOSIS — I10 Essential (primary) hypertension: Secondary | ICD-10-CM | POA: Diagnosis not present

## 2019-10-03 DIAGNOSIS — R296 Repeated falls: Secondary | ICD-10-CM | POA: Diagnosis not present

## 2019-10-07 ENCOUNTER — Encounter: Payer: Self-pay | Admitting: *Deleted

## 2019-10-07 ENCOUNTER — Other Ambulatory Visit: Payer: Self-pay | Admitting: *Deleted

## 2019-10-07 DIAGNOSIS — I10 Essential (primary) hypertension: Secondary | ICD-10-CM | POA: Diagnosis not present

## 2019-10-07 DIAGNOSIS — M6281 Muscle weakness (generalized): Secondary | ICD-10-CM | POA: Diagnosis not present

## 2019-10-07 DIAGNOSIS — R296 Repeated falls: Secondary | ICD-10-CM | POA: Diagnosis not present

## 2019-10-07 DIAGNOSIS — H81399 Other peripheral vertigo, unspecified ear: Secondary | ICD-10-CM | POA: Diagnosis not present

## 2019-10-07 DIAGNOSIS — R2681 Unsteadiness on feet: Secondary | ICD-10-CM | POA: Diagnosis not present

## 2019-10-08 ENCOUNTER — Other Ambulatory Visit: Payer: Self-pay

## 2019-10-08 ENCOUNTER — Ambulatory Visit (INDEPENDENT_AMBULATORY_CARE_PROVIDER_SITE_OTHER): Payer: Medicare Other | Admitting: Cardiology

## 2019-10-08 ENCOUNTER — Encounter: Payer: Self-pay | Admitting: Cardiology

## 2019-10-08 VITALS — BP 138/70 | HR 78 | Ht 64.0 in | Wt 180.6 lb

## 2019-10-08 DIAGNOSIS — R42 Dizziness and giddiness: Secondary | ICD-10-CM | POA: Diagnosis not present

## 2019-10-08 DIAGNOSIS — I10 Essential (primary) hypertension: Secondary | ICD-10-CM

## 2019-10-08 MED ORDER — AMLODIPINE BESYLATE 5 MG PO TABS: 5.0000 mg | ORAL_TABLET | Freq: Every day | ORAL | 1 refills | Status: DC

## 2019-10-08 NOTE — Progress Notes (Signed)
     Clinical Summary Ms. Philyaw is a 78 y.o.female seen as new consult, referred by Dr Nevada Crane for the following medical problems.   1. Dizziness  - episode 10/14 while inside while standing on scale. Immeidately with looking down her vision got altered, "swirling white changes". Felt off balance, treid to sit down and fell to floor. Tried to get up but fell back down again. Felt swimmy headed.  - got onto bed, room was spinning, worst with head movement.   - seen in [facility] for dizziness and fall. Treated for vertigo. From pcp notes +dix hall pike maneuvers. Orthostatics negative at pcp office - referred to PT for vertigo exercises   2. HTN - losartan increased to 50mg  bid by pcp just last week - 140-150s SBPs.   Past Medical History:  Diagnosis Date  . Hyperlipidemia   . Hypertension      Allergies  Allergen Reactions  . Shellfish Allergy      Current Outpatient Medications  Medication Sig Dispense Refill  . HYDROcodone-acetaminophen (NORCO/VICODIN) 5-325 MG per tablet Take 1 tablet by mouth every 6 (six) hours as needed. 15 tablet 0  . losartan (COZAAR) 50 MG tablet Take 50 mg by mouth daily.     No current facility-administered medications for this visit.      Past Surgical History:  Procedure Laterality Date  . APPENDECTOMY       Allergies  Allergen Reactions  . Shellfish Allergy       No family history on file.   Social History Ms. Harrell reports that she has never smoked. She does not have any smokeless tobacco history on file. Ms. Ayars reports no history of alcohol use.   Review of Systems CONSTITUTIONAL: No weight loss, fever, chills, weakness or fatigue.  HEENT: Eyes: No visual loss, blurred vision, double vision or yellow sclerae.No hearing loss, sneezing, congestion, runny nose or sore throat.  SKIN: No rash or itching.  CARDIOVASCULAR: per hpi RESPIRATORY: No shortness of breath, cough or sputum.  GASTROINTESTINAL: No anorexia, nausea,  vomiting or diarrhea. No abdominal pain or blood.  GENITOURINARY: No burning on urination, no polyuria NEUROLOGICAL: per hpi MUSCULOSKELETAL: No muscle, back pain, joint pain or stiffness.  LYMPHATICS: No enlarged nodes. No history of splenectomy.  PSYCHIATRIC: No history of depression or anxiety.  ENDOCRINOLOGIC: No reports of sweating, cold or heat intolerance. No polyuria or polydipsia.  Marland Kitchen   Physical Examination Today's Vitals   10/08/19 1500  BP: 138/70  Pulse: 78  SpO2: 97%  Weight: 180 lb 9.6 oz (81.9 kg)  Height: 5\' 4"  (1.626 m)   Body mass index is 31 kg/m.  Gen: resting comfortably, no acute distress HEENT: no scleral icterus, pupils equal round and reactive, no palptable cervical adenopathy,  CV: RRR, no m/r/g, no jvd Resp: Clear to auscultation bilaterally GI: abdomen is soft, non-tender, non-distended, normal bowel sounds, no hepatosplenomegaly MSK: extremities are warm, no edema.  Skin: warm, no rash Neuro:  no focal deficits Psych: appropriate affect     Assessment and Plan   1. Dizziness/vertigo - symptoms are classic for vertigo, do not suspect any significant cardiac component - no further workup at this time  2. HTN - above goal, start norvacs 5mg  daily        Arnoldo Lenis, M.D.

## 2019-10-08 NOTE — Patient Instructions (Addendum)
Your physician wants you to follow-up in: [place] will receive a reminder letter in the mail two months in advance. If you don't receive a letter, please call our office to schedule the follow-up appointment.  Your physician has recommended you make the following change in your medication:   START AMLODIPINE 5 MG DAILY    DASH Eating Plan DASH stands for "Dietary Approaches to Stop Hypertension." The DASH eating plan is a healthy eating plan that has been shown to reduce high blood pressure (hypertension). It may also reduce your risk for type 2 diabetes, heart disease, and stroke. The DASH eating plan may also help with weight loss. What are tips for following this plan?  General guidelines  Avoid eating more than 2,300 mg (milligrams) of salt (sodium) a day. If you have hypertension, you may need to reduce your sodium intake to 1,500 mg a day.  Limit alcohol intake to no more than 1 drink a day for nonpregnant women and 2 drinks a day for men. One drink equals 12 oz of beer, 5 oz of wine, or 1 oz of hard liquor.  Work with your health care provider to maintain a healthy body weight or to lose weight. Ask what an ideal weight is for you.  Get at least 30 minutes of exercise that causes your heart to beat faster (aerobic exercise) most days of the week. Activities may include walking, swimming, or biking.  Work with your health care provider or diet and nutrition specialist (dietitian) to adjust your eating plan to your individual calorie needs. Reading food labels   Check food labels for the amount of sodium per serving. Choose foods with less than 5 percent of the Daily Value of sodium. Generally, foods with less than 300 mg of sodium per serving fit into this eating plan.  To find whole grains, look for the word "whole" as the first word in the ingredient list. Shopping  Buy products labeled as "low-sodium" or "no salt added."  Buy fresh foods. Avoid canned  foods and premade or frozen meals. Cooking  Avoid adding salt when cooking. Use salt-free seasonings or herbs instead of table salt or sea salt. Check with your health care provider or pharmacist before using salt substitutes.  Do not fry foods. Cook foods using healthy methods such as baking, boiling, grilling, and broiling instead.  Cook with heart-healthy oils, such as olive, canola, soybean, or sunflower oil. Meal planning  Eat a balanced diet that includes: ? 5 or more servings of fruits and vegetables each day. At each meal, try to fill half of your plate with fruits and vegetables. ? Up to 6-8 servings of whole grains each day. ? Less than 6 oz of lean meat, poultry, or fish each day. A 3-oz serving of meat is about the same size as a deck of cards. One egg equals 1 oz. ? 2 servings of low-fat dairy each day. ? A serving of nuts, seeds, or beans 5 times each week. ? Heart-healthy fats. Healthy fats called Omega-3 fatty acids are found in foods such as flaxseeds and coldwater fish, like sardines, salmon, and mackerel.  Limit how much you eat of the following: ? Canned or prepackaged foods. ? Food that is high in trans fat, such as fried foods. ? Food that is high in saturated fat, such as fatty meat. ? Sweets, desserts, sugary drinks, and other foods with added sugar. ? Full-fat dairy products.  Do not salt foods  before eating.  Try to eat at least 2 vegetarian meals each week.  Eat more home-cooked food and less restaurant, buffet, and fast food.  When eating at a restaurant, ask that your food be prepared with less salt or no salt, if possible. What foods are recommended? The items listed may not be a complete list. Talk with your dietitian about what dietary choices are best for you. Grains Whole-grain or whole-wheat bread. Whole-grain or whole-wheat pasta. Brown rice. [place]. Bulgur. Whole-grain and low-sodium cereals. Pita bread. Low-fat, low-sodium crackers.  Whole-wheat flour tortillas. Vegetables Fresh or frozen vegetables (raw, steamed, roasted, or grilled). Low-sodium or reduced-sodium tomato and vegetable juice. Low-sodium or reduced-sodium tomato sauce and tomato paste. Low-sodium or reduced-sodium canned vegetables. Fruits All fresh, dried, or frozen fruit. Canned fruit in natural juice (without added sugar). Meat and other protein foods Skinless chicken or [place]. Ground chicken or [place]. Pork with fat trimmed off. Fish and seafood. Egg whites. Dried beans, peas, or lentils. Unsalted nuts, nut butters, and seeds. Unsalted canned beans. Lean cuts of beef with fat trimmed off. Low-sodium, lean deli meat. Dairy Low-fat (1%) or fat-free (skim) milk. Fat-free, low-fat, or reduced-fat cheeses. Nonfat, low-sodium ricotta or cottage cheese. Low-fat or nonfat yogurt. Low-fat, low-sodium cheese. Fats and oils Soft margarine without trans fats. Vegetable oil. Low-fat, reduced-fat, or light mayonnaise and salad dressings (reduced-sodium). Canola, safflower, olive, soybean, and sunflower oils. Avocado. Seasoning and other foods Herbs. Spices. Seasoning mixes without salt. Unsalted popcorn and pretzels. Fat-free sweets. What foods are not recommended? The items listed may not be a complete list. Talk with your dietitian about what dietary choices are best for you. Grains Baked goods made with fat, such as croissants, muffins, or some breads. Dry pasta or rice meal packs. Vegetables Creamed or fried vegetables. Vegetables in a cheese sauce. Regular canned vegetables (not low-sodium or reduced-sodium). Regular canned tomato sauce and paste (not low-sodium or reduced-sodium). Regular tomato and vegetable juice (not low-sodium or reduced-sodium). Angie Fava. Olives. Fruits Canned fruit in a light or heavy syrup. Fried fruit. Fruit in cream or butter sauce. Meat and other protein foods Fatty cuts of meat. Ribs. Fried meat. Berniece Salines. Sausage. Bologna and other  processed lunch meats. Salami. Fatback. Hotdogs. Bratwurst. Salted nuts and seeds. Canned beans with added salt. Canned or smoked fish. Whole eggs or egg yolks. Chicken or [place] with skin. Dairy Whole or 2% milk, cream, and half-and-half. Whole or full-fat cream cheese. Whole-fat or sweetened yogurt. Full-fat cheese. Nondairy creamers. Whipped toppings. Processed cheese and cheese spreads. Fats and oils Butter. Stick margarine. Lard. Shortening. Ghee. Bacon fat. Tropical oils, such as coconut, palm kernel, or palm oil. Seasoning and other foods Salted popcorn and pretzels. Onion salt, garlic salt, seasoned salt, table salt, and sea salt. Worcestershire sauce. Tartar sauce. Barbecue sauce. Teriyaki sauce. Soy sauce, including reduced-sodium. Steak sauce. Canned and packaged gravies. Fish sauce. Oyster sauce. Cocktail sauce. Horseradish that you find on the shelf. Ketchup. Mustard. Meat flavorings and tenderizers. Bouillon cubes. Hot sauce and Tabasco sauce. Premade or packaged marinades. Premade or packaged taco seasonings. Relishes. Regular salad dressings. Where to find more information:  National Heart, Lung, and [place]: https://wilson-eaton.com/  American Heart Association: www.heart.org Summary  The DASH eating plan is a healthy eating plan that has been shown to reduce high blood pressure (hypertension). It may also reduce your risk for type 2 diabetes, heart disease, and stroke.  With the DASH eating plan, you should limit salt (sodium) intake to 2,300 mg  a day. If you have hypertension, you may need to reduce your sodium intake to 1,500 mg a day.  When on the DASH eating plan, aim to eat more fresh fruits and vegetables, whole grains, lean proteins, low-fat dairy, and heart-healthy fats.  Work with your health care provider or diet and nutrition specialist (dietitian) to adjust your eating plan to your individual calorie needs. This information is not intended to replace advice given to  you by your health care provider. Make sure you discuss any questions you have with your health care provider. Document Released: 11/10/2011 Document Revised: 11/03/2017 Document Reviewed: 11/14/2016 Elsevier Patient Education  2020 [employer].

## 2019-10-10 DIAGNOSIS — I10 Essential (primary) hypertension: Secondary | ICD-10-CM | POA: Diagnosis not present

## 2019-10-10 DIAGNOSIS — R2681 Unsteadiness on feet: Secondary | ICD-10-CM | POA: Diagnosis not present

## 2019-10-10 DIAGNOSIS — R296 Repeated falls: Secondary | ICD-10-CM | POA: Diagnosis not present

## 2019-10-10 DIAGNOSIS — M6281 Muscle weakness (generalized): Secondary | ICD-10-CM | POA: Diagnosis not present

## 2019-10-10 DIAGNOSIS — H81399 Other peripheral vertigo, unspecified ear: Secondary | ICD-10-CM | POA: Diagnosis not present

## 2019-10-14 DIAGNOSIS — R2681 Unsteadiness on feet: Secondary | ICD-10-CM | POA: Diagnosis not present

## 2019-10-14 DIAGNOSIS — H81399 Other peripheral vertigo, unspecified ear: Secondary | ICD-10-CM | POA: Diagnosis not present

## 2019-10-14 DIAGNOSIS — I10 Essential (primary) hypertension: Secondary | ICD-10-CM | POA: Diagnosis not present

## 2019-10-14 DIAGNOSIS — M6281 Muscle weakness (generalized): Secondary | ICD-10-CM | POA: Diagnosis not present

## 2019-10-14 DIAGNOSIS — R296 Repeated falls: Secondary | ICD-10-CM | POA: Diagnosis not present

## 2019-10-16 DIAGNOSIS — I10 Essential (primary) hypertension: Secondary | ICD-10-CM | POA: Diagnosis not present

## 2019-10-16 DIAGNOSIS — R42 Dizziness and giddiness: Secondary | ICD-10-CM | POA: Diagnosis not present

## 2019-10-16 DIAGNOSIS — R2681 Unsteadiness on feet: Secondary | ICD-10-CM | POA: Diagnosis not present

## 2019-10-16 DIAGNOSIS — H8110 Benign paroxysmal vertigo, unspecified ear: Secondary | ICD-10-CM | POA: Diagnosis not present

## 2019-10-16 DIAGNOSIS — H81399 Other peripheral vertigo, unspecified ear: Secondary | ICD-10-CM | POA: Diagnosis not present

## 2019-10-16 DIAGNOSIS — R296 Repeated falls: Secondary | ICD-10-CM | POA: Diagnosis not present

## 2019-10-16 DIAGNOSIS — M6281 Muscle weakness (generalized): Secondary | ICD-10-CM | POA: Diagnosis not present

## 2019-11-20 DIAGNOSIS — I10 Essential (primary) hypertension: Secondary | ICD-10-CM | POA: Diagnosis not present

## 2019-11-20 DIAGNOSIS — E782 Mixed hyperlipidemia: Secondary | ICD-10-CM | POA: Diagnosis not present

## 2020-01-20 DIAGNOSIS — Z23 Encounter for immunization: Secondary | ICD-10-CM | POA: Diagnosis not present

## 2020-01-20 DIAGNOSIS — L239 Allergic contact dermatitis, unspecified cause: Secondary | ICD-10-CM | POA: Diagnosis not present

## 2020-01-20 DIAGNOSIS — Z712 Person consulting for explanation of examination or test findings: Secondary | ICD-10-CM | POA: Diagnosis not present

## 2020-01-20 DIAGNOSIS — R7301 Impaired fasting glucose: Secondary | ICD-10-CM | POA: Diagnosis not present

## 2020-01-20 DIAGNOSIS — I10 Essential (primary) hypertension: Secondary | ICD-10-CM | POA: Diagnosis not present

## 2020-01-20 DIAGNOSIS — M25511 Pain in right shoulder: Secondary | ICD-10-CM | POA: Diagnosis not present

## 2020-01-22 DIAGNOSIS — E782 Mixed hyperlipidemia: Secondary | ICD-10-CM | POA: Diagnosis not present

## 2020-01-22 DIAGNOSIS — I10 Essential (primary) hypertension: Secondary | ICD-10-CM | POA: Diagnosis not present

## 2020-01-22 DIAGNOSIS — Z0001 Encounter for general adult medical examination with abnormal findings: Secondary | ICD-10-CM | POA: Diagnosis not present

## 2020-01-22 DIAGNOSIS — E1169 Type 2 diabetes mellitus with other specified complication: Secondary | ICD-10-CM | POA: Diagnosis not present

## 2020-05-28 ENCOUNTER — Other Ambulatory Visit: Payer: Self-pay | Admitting: Cardiology

## 2020-06-15 ENCOUNTER — Ambulatory Visit: Payer: Medicare Other | Admitting: Cardiology

## 2020-07-20 DIAGNOSIS — E1169 Type 2 diabetes mellitus with other specified complication: Secondary | ICD-10-CM | POA: Diagnosis not present

## 2020-07-20 DIAGNOSIS — Z712 Person consulting for explanation of examination or test findings: Secondary | ICD-10-CM | POA: Diagnosis not present

## 2020-07-20 DIAGNOSIS — Z23 Encounter for immunization: Secondary | ICD-10-CM | POA: Diagnosis not present

## 2020-07-20 DIAGNOSIS — R42 Dizziness and giddiness: Secondary | ICD-10-CM | POA: Diagnosis not present

## 2020-07-20 DIAGNOSIS — Z0001 Encounter for general adult medical examination with abnormal findings: Secondary | ICD-10-CM | POA: Diagnosis not present

## 2020-07-20 DIAGNOSIS — H8110 Benign paroxysmal vertigo, unspecified ear: Secondary | ICD-10-CM | POA: Diagnosis not present

## 2020-07-22 DIAGNOSIS — I10 Essential (primary) hypertension: Secondary | ICD-10-CM | POA: Diagnosis not present

## 2020-07-22 DIAGNOSIS — M79672 Pain in left foot: Secondary | ICD-10-CM | POA: Diagnosis not present

## 2020-07-22 DIAGNOSIS — R42 Dizziness and giddiness: Secondary | ICD-10-CM | POA: Diagnosis not present

## 2020-07-22 DIAGNOSIS — E1169 Type 2 diabetes mellitus with other specified complication: Secondary | ICD-10-CM | POA: Diagnosis not present

## 2020-07-22 DIAGNOSIS — E782 Mixed hyperlipidemia: Secondary | ICD-10-CM | POA: Diagnosis not present

## 2020-08-24 ENCOUNTER — Encounter: Payer: Self-pay | Admitting: Podiatry

## 2020-08-24 ENCOUNTER — Ambulatory Visit (INDEPENDENT_AMBULATORY_CARE_PROVIDER_SITE_OTHER): Payer: Medicare Other

## 2020-08-24 ENCOUNTER — Ambulatory Visit: Payer: Medicare Other | Admitting: Podiatry

## 2020-08-24 ENCOUNTER — Other Ambulatory Visit: Payer: Self-pay

## 2020-08-24 DIAGNOSIS — M779 Enthesopathy, unspecified: Secondary | ICD-10-CM | POA: Diagnosis not present

## 2020-08-24 DIAGNOSIS — M722 Plantar fascial fibromatosis: Secondary | ICD-10-CM

## 2020-08-24 DIAGNOSIS — M79672 Pain in left foot: Secondary | ICD-10-CM

## 2020-08-24 NOTE — Patient Instructions (Signed)

## 2020-08-27 NOTE — Progress Notes (Signed)
Subjective:   Patient ID: Tina Larson, female   DOB: 79 y.o.   MRN: [id number]   HPI 79 year old female presents the office today for concerns of left foot pain which is been on the last 3 months.  Mostly at nighttime after she takes her shoes off the end of the day.  She feels that there is a "grinding" sensation.  She does have intermittent swelling.  She has a history of injury 3 years ago but nothing recent.  No recent treatment.  She has no other concerns today.   Review of Systems  All other systems reviewed and are negative.  Past Medical History:  Diagnosis Date  . Hyperlipidemia   . Hypertension     Past Surgical History:  Procedure Laterality Date  . APPENDECTOMY       Current Outpatient Medications:  .  amLODipine (NORVASC) 5 MG tablet, TAKE 1 TABLET BY MOUTH EVERY DAY, Disp: 90 tablet, Rfl: 1 .  aspirin EC 81 MG tablet, Take 81 mg by mouth. 2-3 x per week, Disp: , Rfl:  .  losartan (COZAAR) 50 MG tablet, Take 50 mg by mouth 2 (two) times daily. , Disp: , Rfl:  .  meclizine (ANTIVERT) 25 MG tablet, Take 25 mg by mouth 3 (three) times daily as needed for dizziness., Disp: , Rfl:  .  VITAMIN D PO, Take 1 tablet by mouth daily., Disp: , Rfl:   Allergies  Allergen Reactions  . Shellfish Allergy          Objective:  Physical Exam  General: AAO x3, NAD  Dermatological: Skin is warm, dry and supple bilateral.  There are no open sores, no preulcerative lesions, no rash or signs of infection present.  Vascular: Dorsalis Pedis artery and Posterior Tibial artery pedal pulses are 2/4 bilateral with immedate capillary fill time.There is no pain with calf compression, swelling, warmth, erythema.   Neruologic: Grossly intact via light touch bilateral.  Negative Tinel sign.  Musculoskeletal: There is tenderness along medial aspect on the first metatarsal and slight on the plantar aspect of the metatarsal.  This is more of muscle, tendon issue as opposed to bone.  Mild  discomfort the first metatarsal cuneiform joint.  No significant edema, erythema.  Flexor, extensor tendons appear to be intact.  Muscular strength 5/5 in all groups tested bilateral.  Gait: Unassisted, Nonantalgic.       Assessment:   79 year old female with tendinitis, left foot pain     Plan:  -Treatment options discussed including all alternatives, risks, and complications -Etiology of symptoms were discussed -X-rays were obtained and reviewed with the patient.  No evidence of acute fracture or stress fracture identified today. -Discussed her to wear more supportive shoes and good arch supports.  Voltaren gel as needed.  Ice.  Discussed stretching, rehab exercises as well.    Vivi Barrack DPM

## 2020-10-17 ENCOUNTER — Other Ambulatory Visit: Payer: Self-pay

## 2020-10-17 ENCOUNTER — Emergency Department (HOSPITAL_COMMUNITY): Payer: Medicare Other

## 2020-10-17 ENCOUNTER — Emergency Department (HOSPITAL_COMMUNITY)
Admission: EM | Admit: 2020-10-17 | Discharge: 2020-10-17 | Disposition: A | Discharge status: Home / Self Care | Payer: Medicare Other | Attending: Emergency Medicine | Admitting: Emergency Medicine

## 2020-10-17 ENCOUNTER — Encounter (HOSPITAL_COMMUNITY): Payer: Self-pay | Admitting: *Deleted

## 2020-10-17 DIAGNOSIS — W010XXA Fall on same level from slipping, tripping and stumbling without subsequent striking against object, initial encounter: Secondary | ICD-10-CM | POA: Insufficient documentation

## 2020-10-17 DIAGNOSIS — Z79899 Other long term (current) drug therapy: Secondary | ICD-10-CM | POA: Diagnosis not present

## 2020-10-17 DIAGNOSIS — Z7982 Long term (current) use of aspirin: Secondary | ICD-10-CM | POA: Diagnosis not present

## 2020-10-17 DIAGNOSIS — M25571 Pain in right ankle and joints of right foot: Secondary | ICD-10-CM | POA: Insufficient documentation

## 2020-10-17 DIAGNOSIS — I10 Essential (primary) hypertension: Secondary | ICD-10-CM | POA: Diagnosis not present

## 2020-10-17 DIAGNOSIS — M7989 Other specified soft tissue disorders: Secondary | ICD-10-CM | POA: Diagnosis not present

## 2020-10-17 DIAGNOSIS — S96911A Strain of unspecified muscle and tendon at ankle and foot level, right foot, initial encounter: Secondary | ICD-10-CM

## 2020-10-17 DIAGNOSIS — S93401A Sprain of unspecified ligament of right ankle, initial encounter: Secondary | ICD-10-CM

## 2020-10-17 DIAGNOSIS — S93601A Unspecified sprain of right foot, initial encounter: Secondary | ICD-10-CM | POA: Diagnosis not present

## 2020-10-17 DIAGNOSIS — M19071 Primary osteoarthritis, right ankle and foot: Secondary | ICD-10-CM | POA: Diagnosis not present

## 2020-10-17 MED ORDER — ACETAMINOPHEN 500 MG PO TABS
1000.0000 mg | ORAL_TABLET | Freq: Once | ORAL | Status: AC
  Administered 2020-10-17: 1000 mg via ORAL
  Filled 2020-10-17: qty 2

## 2020-10-17 NOTE — ED Triage Notes (Signed)
Pt tripped over a stake and fell, c/o right foot and ankle pain.  Pt not able to put weight on that foot.  Pt denies hitting her head and denies taking blood thinners.

## 2020-10-17 NOTE — ED Provider Notes (Signed)
[facility] EMERGENCY DEPARTMENT Provider Note   CSN: [id number] Arrival date & time: 10/17/20  1809     History Chief Complaint  Patient presents with  . Fall    Tina Larson is a 79 y.o. female.  Patient c/o trip and fall over stake, falling forward, with pain to right ankle and proximal foot area. Symptoms acute onset, moderate, dull, persistent, non radiating. No faintness or dizziness prior to fall. No loc. Denies head injury or headache. No neck or back pain. Other than foot/ankle pain, denies any other pain or injury. Skin is intact. No numbness/weakness.   The history is provided by the patient.  Fall Pertinent negatives include no headaches.       Past Medical History:  Diagnosis Date  . Hyperlipidemia   . Hypertension     Patient Active Problem List   Diagnosis Date Noted  . Ankle fracture 12/03/2013    Past Surgical History:  Procedure Laterality Date  . APPENDECTOMY       OB History   No obstetric history on file.     History reviewed. No pertinent family history.  Social History   Tobacco Use  . Smoking status: Never Smoker  . Smokeless tobacco: Never Used  Substance Use Topics  . Alcohol use: No  . Drug use: No    Home Medications Prior to Admission medications   Medication Sig Start Date End Date Taking? Authorizing Provider  amLODipine (NORVASC) 5 MG tablet TAKE 1 TABLET BY MOUTH EVERY DAY 05/28/20   Antoine Poche, MD  aspirin EC 81 MG tablet Take 81 mg by mouth. 2-3 x per week    [provider]  losartan (COZAAR) 50 MG tablet Take 50 mg by mouth 2 (two) times daily.     [provider]  meclizine (ANTIVERT) 25 MG tablet Take 25 mg by mouth 3 (three) times daily as needed for dizziness.    [provider]  VITAMIN D PO Take 1 tablet by mouth daily.    [provider]    Allergies    Shellfish allergy  Review of Systems   Review of Systems  Constitutional: Negative for fever.    Musculoskeletal:       Right ankle/foot pain  Skin: Negative for wound.  Neurological: Negative for numbness and headaches.    Physical Exam Updated Vital Signs BP (!) 179/51 (BP Location: Right Arm)   Pulse 71   Temp 97.7 F (36.5 C) (Oral)   Resp 14   Ht 1.626 m (5\' 4" )   Wt 81.6 kg   SpO2 100%   BMI 30.90 kg/m   Physical Exam Vitals and nursing note reviewed.  Constitutional:      Appearance: Normal appearance. She is well-developed.  HENT:     Head: Atraumatic.     Nose: Nose normal.     Mouth/Throat:     Mouth: Mucous membranes are moist.  Eyes:     General: No scleral icterus.    Conjunctiva/sclera: Conjunctivae normal.  Neck:     Trachea: No tracheal deviation.     Comments: C spine non tender, aligned, no step off.  Cardiovascular:     Rate and Rhythm: Normal rate.     Pulses: Normal pulses.  Pulmonary:     Effort: Pulmonary effort is normal. No respiratory distress.  Abdominal:     General: There is no distension.     Tenderness: There is no abdominal tenderness.  Genitourinary:    Comments:  No cva tenderness.  Musculoskeletal:     Cervical back: Normal range of motion and neck supple. No rigidity. No muscular tenderness.     Comments: Tenderness right ankle anteriorly. Ankle is grossly stable. Distal pulses palp. No 5th MT tenderness. Normal cap refill distally in toes. Normal movement of toes. No tib/fib or knee pain or tenderness.   Skin:    General: Skin is warm and dry.     Findings: No rash.  Neurological:     Mental Status: She is alert.     Comments: Alert, speech normal. Right foot, motor/sens intact.   Psychiatric:        Mood and Affect: Mood normal.     ED Results / Procedures / Treatments   Labs (all labs ordered are listed, but only abnormal results are displayed) Labs Reviewed - No data to display  EKG None  Radiology DG Ankle Complete Right  Result Date: 10/17/2020 CLINICAL DATA:  Larey Seat EXAM: RIGHT FOOT COMPLETE - 3+  VIEW; RIGHT ANKLE - COMPLETE 3+ VIEW COMPARISON:  July 16, 2018 FINDINGS: Osteopenia. No acute fracture or dislocation. Mild degenerative changes of the first MTP. Small lucency at the medial talar dome likely reflecting a small osteochondral lesion. Ankle mortise is preserved. No area of erosion or osseous destruction. No unexpected radiopaque foreign body. Soft tissues are unremarkable. IMPRESSION: 1. No acute fracture or dislocation of the right foot or ankle. 2. Probable small osteochondral lesion at the medial talar dome, unchanged. Electronically Signed   By: Meda Klinefelter MD   On: 10/17/2020 19:02   DG Foot Complete Right  Result Date: 10/17/2020 CLINICAL DATA:  Larey Seat EXAM: RIGHT FOOT COMPLETE - 3+ VIEW; RIGHT ANKLE - COMPLETE 3+ VIEW COMPARISON:  July 16, 2018 FINDINGS: Osteopenia. No acute fracture or dislocation. Mild degenerative changes of the first MTP. Small lucency at the medial talar dome likely reflecting a small osteochondral lesion. Ankle mortise is preserved. No area of erosion or osseous destruction. No unexpected radiopaque foreign body. Soft tissues are unremarkable. IMPRESSION: 1. No acute fracture or dislocation of the right foot or ankle. 2. Probable small osteochondral lesion at the medial talar dome, unchanged. Electronically Signed   By: Meda Klinefelter MD   On: 10/17/2020 19:02    Procedures Procedures (including critical care time)  Medications Ordered in ED Medications  acetaminophen (TYLENOL) tablet 1,000 mg (has no administration in time range)    ED Course  I have reviewed the triage vital signs and the nursing notes.  Pertinent labs & imaging results that were available during my care of the patient were reviewed by me and considered in my medical decision making (see chart for details).    MDM Rules/Calculators/A&P                         Imaging ordered.   Reviewed nursing notes and prior charts for additional history.   xrays  reviewed/interpreted by me - no fx.   Icepack. aso brace/support.   Acetaminophen po.   Final Clinical Impression(s) / ED Diagnoses Final diagnoses:  None    Rx / DC Orders ED Discharge Orders    None       Cathren Laine, MD 10/17/20 2230

## 2020-10-17 NOTE — Discharge Instructions (Addendum)
It was our pleasure to provide your ER care today - we hope that you feel better.  Wear brace for comfort/support.   Icepack to sore area.  Take acetaminophen or ibuprofen as need.   Follow up with your doctor in 1 week if symptoms fail to improve/resolve.  Return to ER if worse, new symptoms, severe pain, numbness/weakness, or other concern.

## 2020-10-26 ENCOUNTER — Ambulatory Visit: Payer: Medicare Other | Admitting: Podiatry

## 2020-11-13 IMAGING — DX DG FOOT COMPLETE 3+V*R*
3 series · 3 of 3 positions shown · non-contrast
Comparison: July 16, 2018

CLINICAL DATA: Fell

EXAM:
RIGHT FOOT COMPLETE - 3+ VIEW; RIGHT ANKLE - COMPLETE 3+ VIEW

[foot ap]
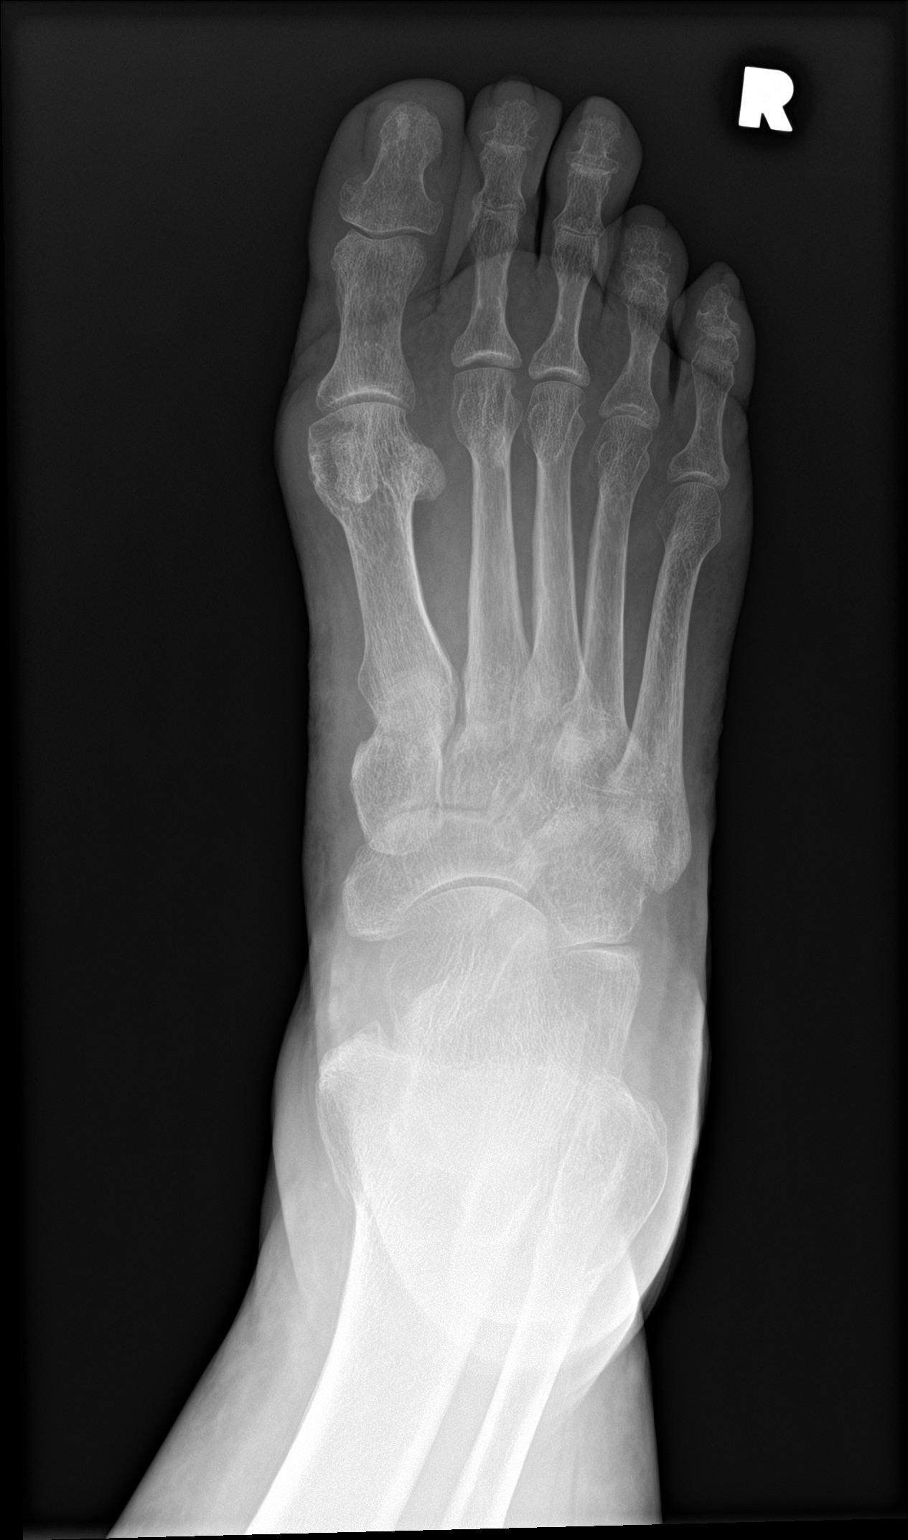

[foot obl]
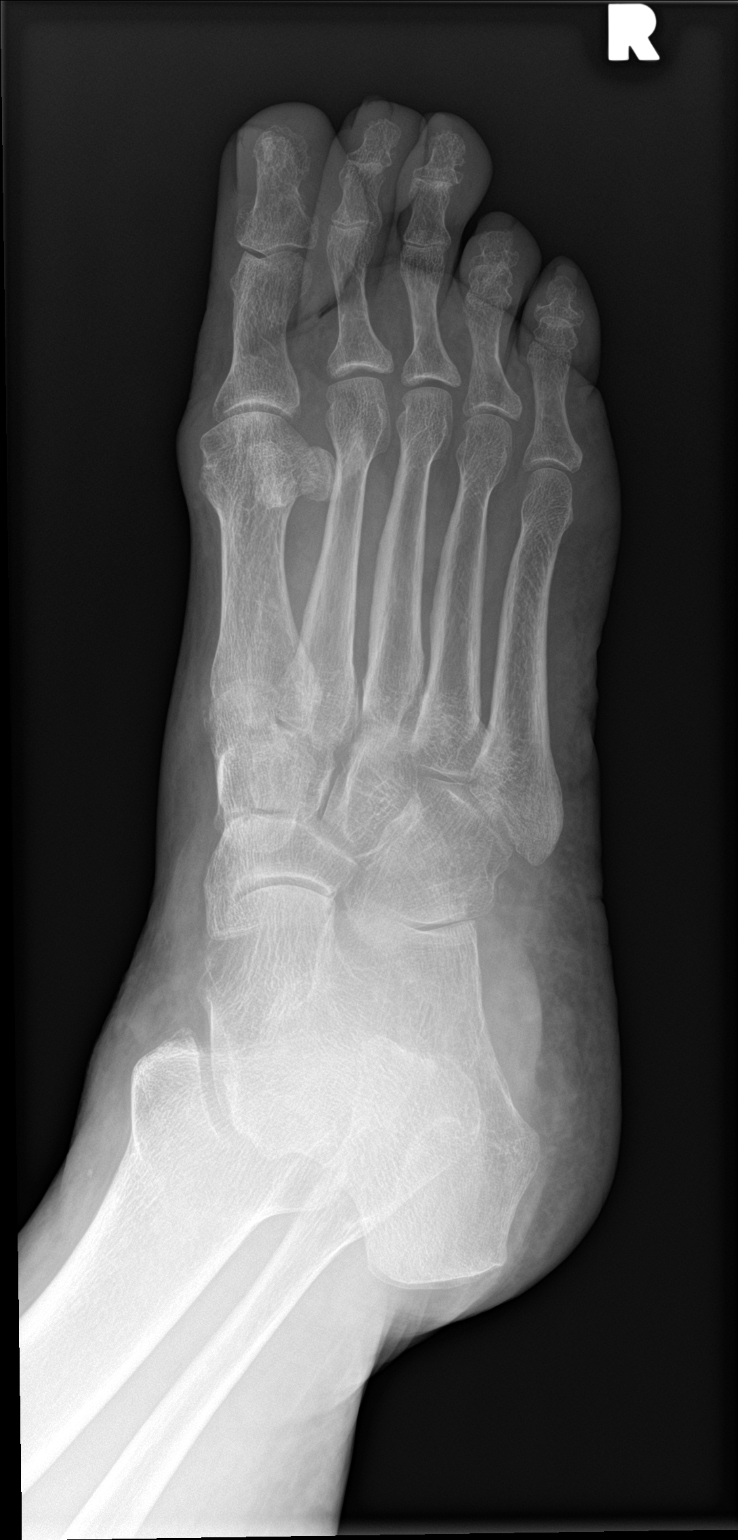

[foot lat]
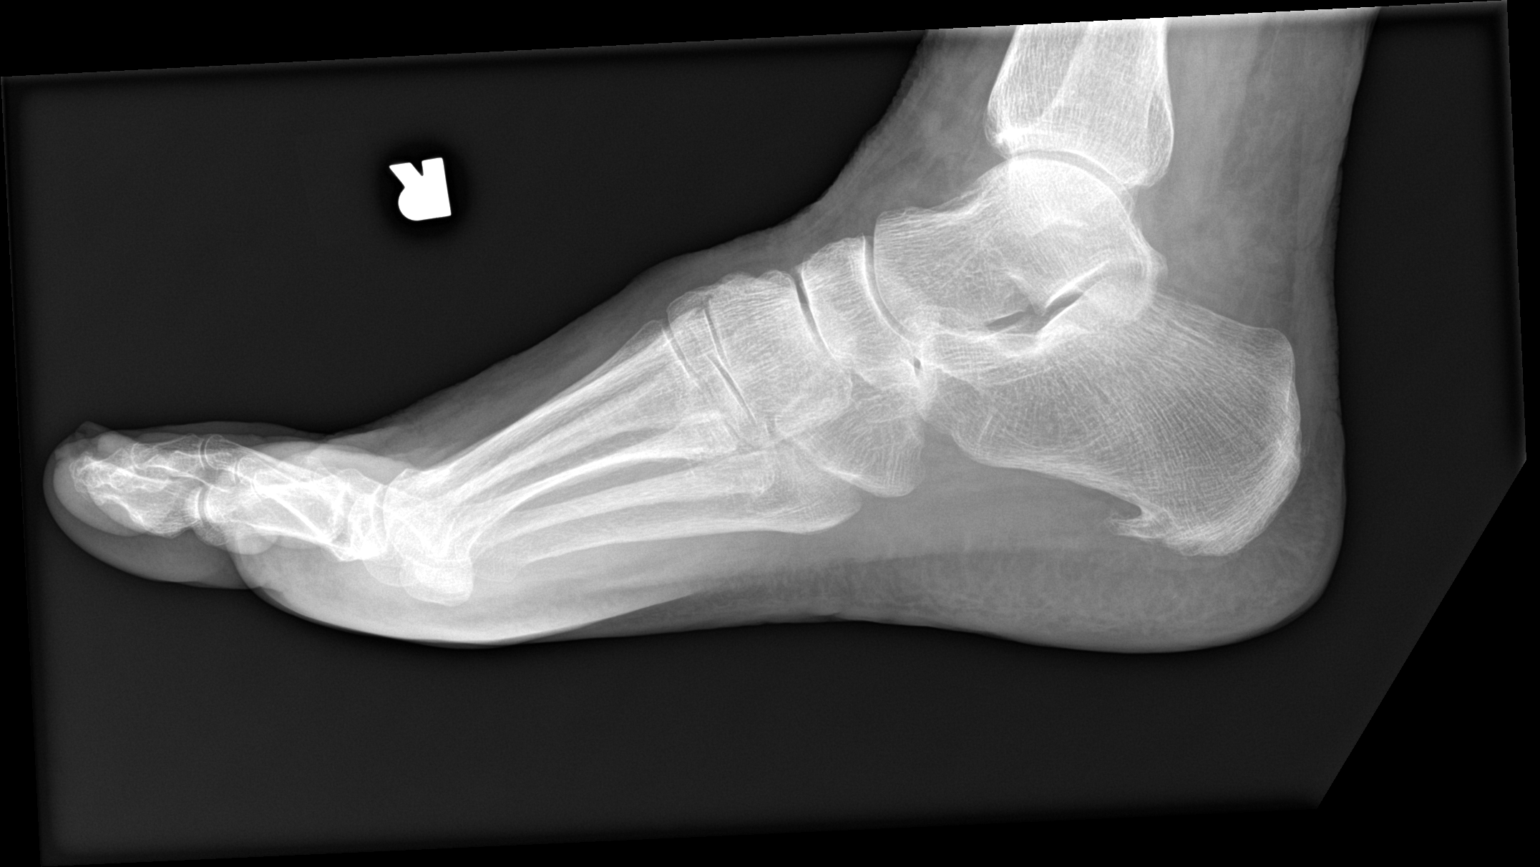

[3 of 3 positions shown; findings below may reference images not displayed]

FINDINGS: Osteopenia. No acute fracture or dislocation. Mild degenerative
changes of the first MTP. Small lucency at the medial talar dome
likely reflecting a small osteochondral lesion. Ankle mortise is
preserved. No area of erosion or osseous destruction. No unexpected
radiopaque foreign body. Soft tissues are unremarkable.
IMPRESSION: 1. No acute fracture or dislocation of the right foot or ankle.
2. Probable small osteochondral lesion at the medial talar dome,
unchanged.

## 2020-11-13 IMAGING — DX DG ANKLE COMPLETE 3+V*R*
3 series · 3 of 3 positions shown · non-contrast
Comparison: July 16, 2018

CLINICAL DATA: Fell

EXAM:
RIGHT FOOT COMPLETE - 3+ VIEW; RIGHT ANKLE - COMPLETE 3+ VIEW

[ankle ap]
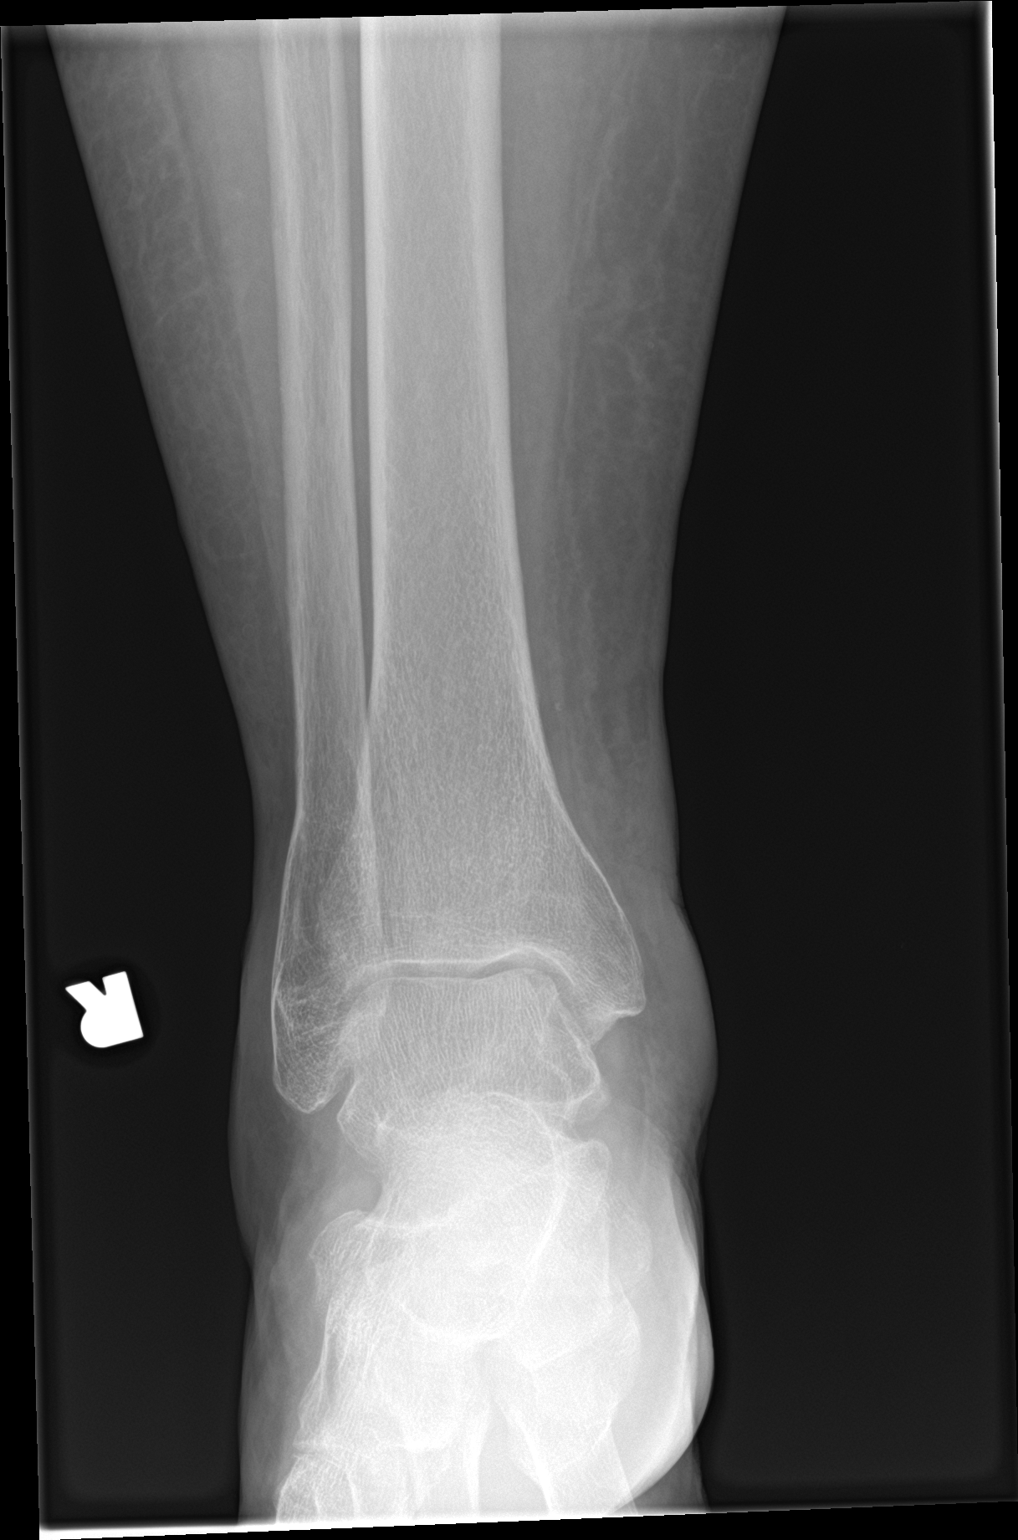

[ankle obl]
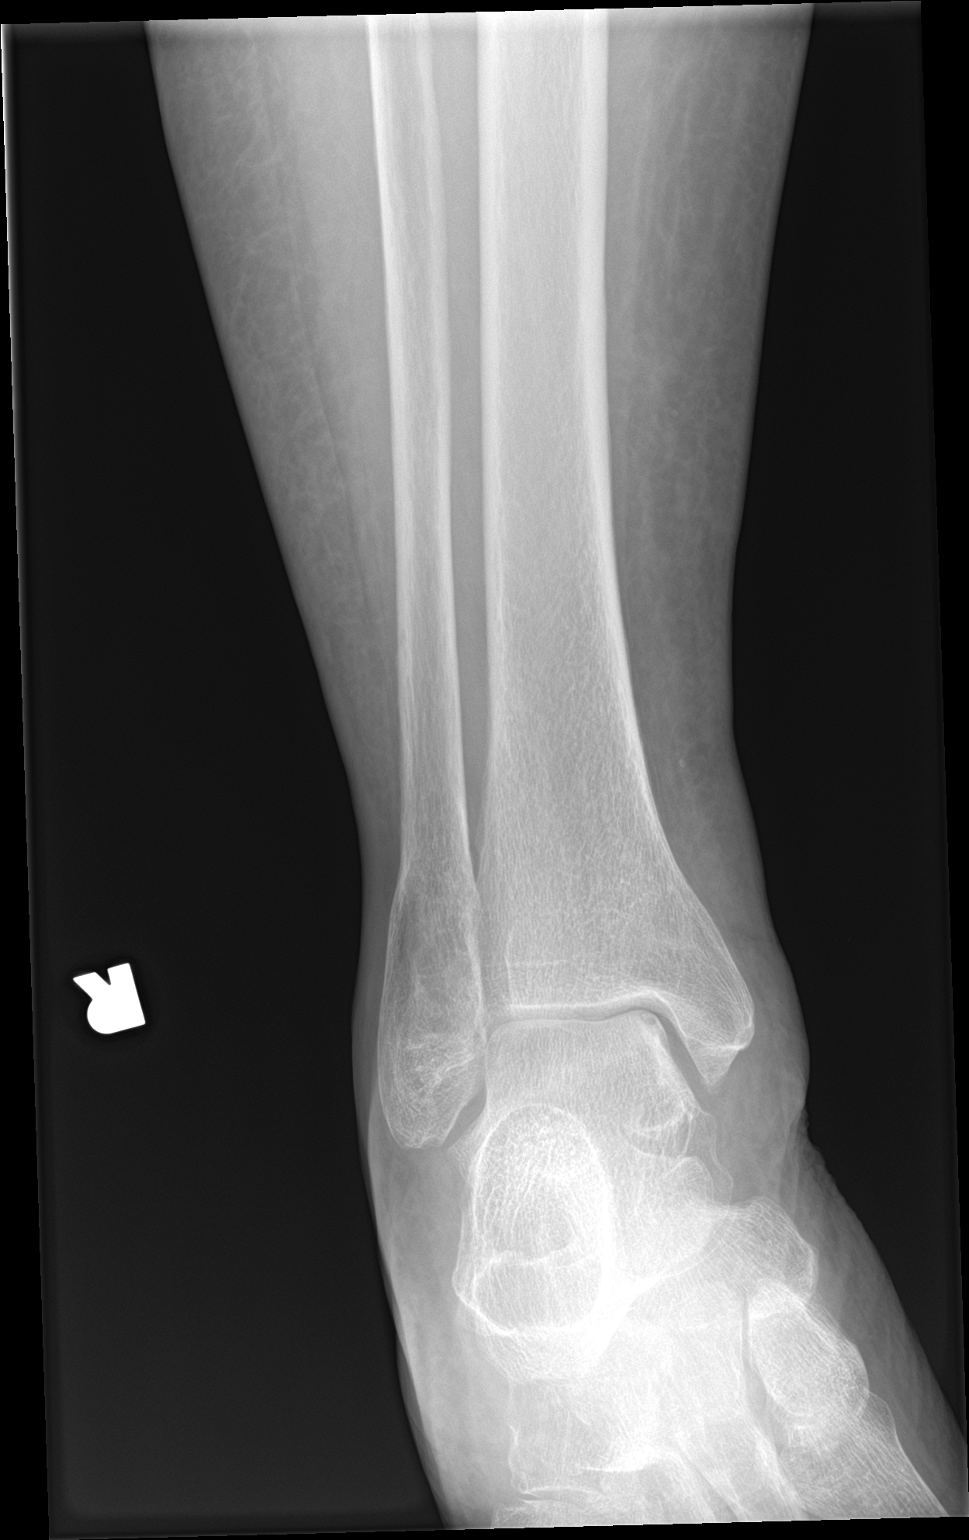

[ankle lat]
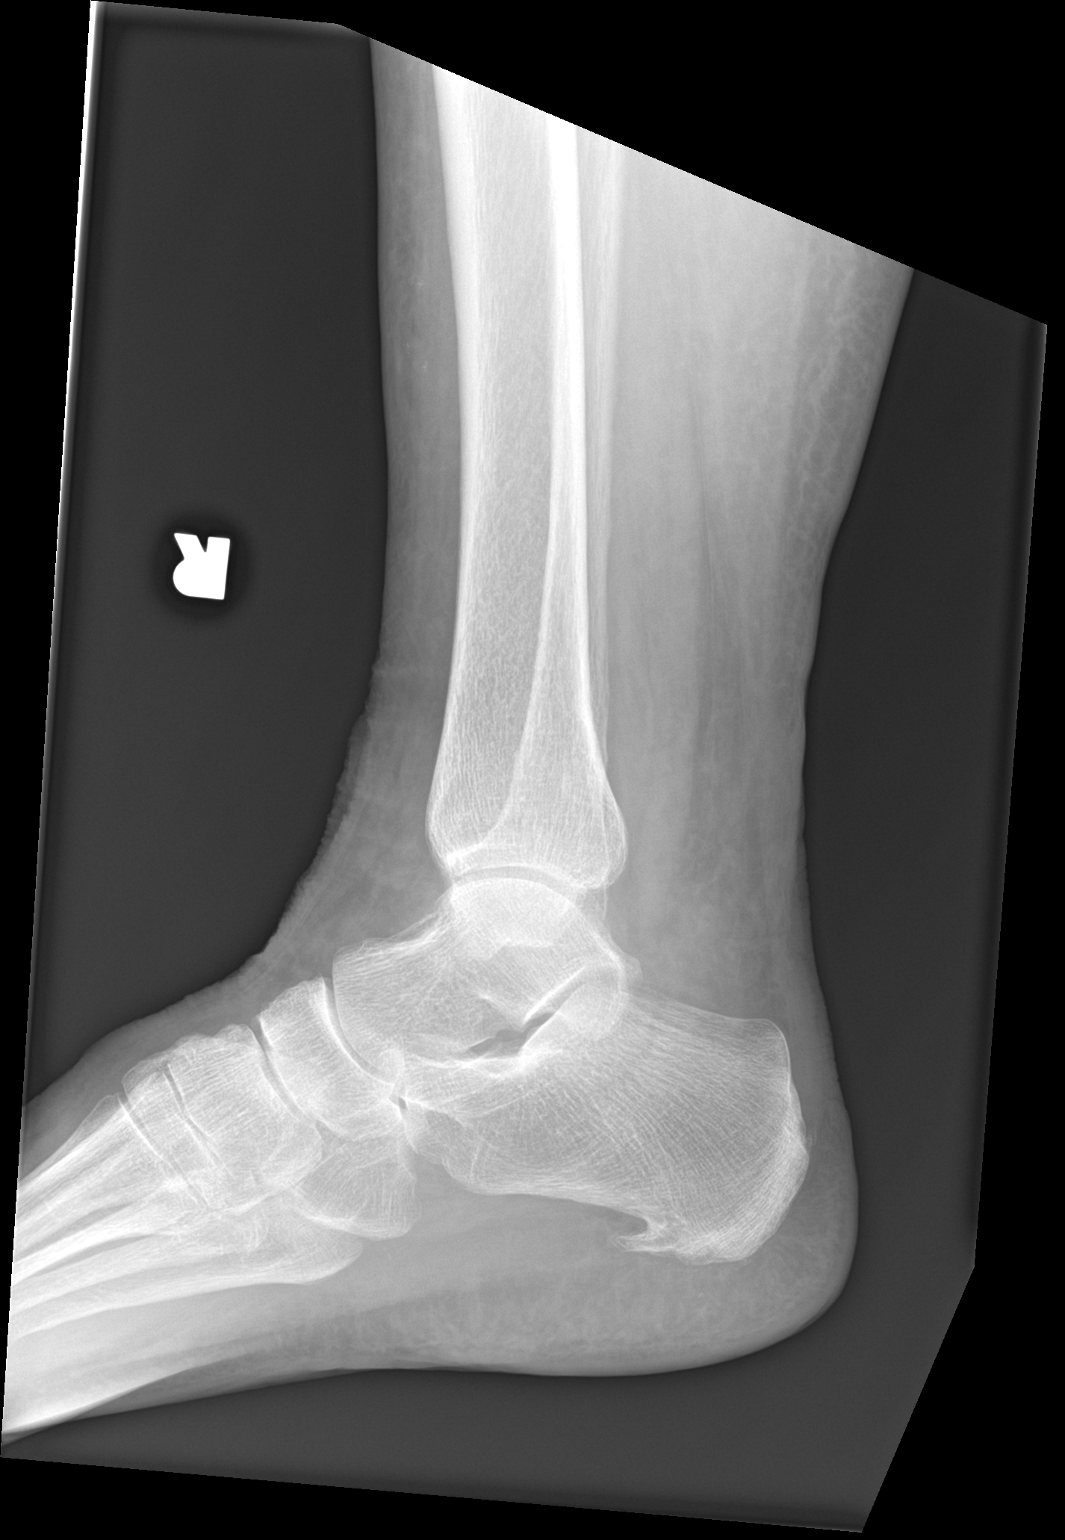

[3 of 3 positions shown; findings below may reference images not displayed]

FINDINGS: Osteopenia. No acute fracture or dislocation. Mild degenerative
changes of the first MTP. Small lucency at the medial talar dome
likely reflecting a small osteochondral lesion. Ankle mortise is
preserved. No area of erosion or osseous destruction. No unexpected
radiopaque foreign body. Soft tissues are unremarkable.
IMPRESSION: 1. No acute fracture or dislocation of the right foot or ankle.
2. Probable small osteochondral lesion at the medial talar dome,
unchanged.

## 2020-11-25 ENCOUNTER — Other Ambulatory Visit: Payer: Self-pay | Admitting: Cardiology

## 2020-12-09 DIAGNOSIS — S93691S Other sprain of right foot, sequela: Secondary | ICD-10-CM | POA: Diagnosis not present

## 2020-12-09 DIAGNOSIS — M25551 Pain in right hip: Secondary | ICD-10-CM | POA: Diagnosis not present

## 2020-12-14 ENCOUNTER — Other Ambulatory Visit: Payer: Self-pay

## 2020-12-14 ENCOUNTER — Other Ambulatory Visit (HOSPITAL_COMMUNITY): Payer: Self-pay | Admitting: Internal Medicine

## 2020-12-14 ENCOUNTER — Ambulatory Visit (HOSPITAL_COMMUNITY)
Admission: RE | Admit: 2020-12-14 | Discharge: 2020-12-14 | Disposition: A | Discharge status: Home / Self Care | Payer: Medicare Other | Source: Ambulatory Visit | Attending: Internal Medicine | Admitting: Internal Medicine

## 2020-12-14 DIAGNOSIS — M25551 Pain in right hip: Secondary | ICD-10-CM | POA: Diagnosis not present

## 2021-01-18 DIAGNOSIS — Z0001 Encounter for general adult medical examination with abnormal findings: Secondary | ICD-10-CM | POA: Diagnosis not present

## 2021-01-18 DIAGNOSIS — E1169 Type 2 diabetes mellitus with other specified complication: Secondary | ICD-10-CM | POA: Diagnosis not present

## 2021-01-18 DIAGNOSIS — M79672 Pain in left foot: Secondary | ICD-10-CM | POA: Diagnosis not present

## 2021-01-18 DIAGNOSIS — Z23 Encounter for immunization: Secondary | ICD-10-CM | POA: Diagnosis not present

## 2021-01-18 DIAGNOSIS — Z712 Person consulting for explanation of examination or test findings: Secondary | ICD-10-CM | POA: Diagnosis not present

## 2021-01-20 DIAGNOSIS — M79672 Pain in left foot: Secondary | ICD-10-CM | POA: Diagnosis not present

## 2021-01-20 DIAGNOSIS — E782 Mixed hyperlipidemia: Secondary | ICD-10-CM | POA: Diagnosis not present

## 2021-01-20 DIAGNOSIS — E1169 Type 2 diabetes mellitus with other specified complication: Secondary | ICD-10-CM | POA: Diagnosis not present

## 2021-01-20 DIAGNOSIS — I10 Essential (primary) hypertension: Secondary | ICD-10-CM | POA: Diagnosis not present

## 2021-01-20 DIAGNOSIS — H8110 Benign paroxysmal vertigo, unspecified ear: Secondary | ICD-10-CM | POA: Diagnosis not present

## 2021-01-20 DIAGNOSIS — R42 Dizziness and giddiness: Secondary | ICD-10-CM | POA: Diagnosis not present

## 2021-01-24 ENCOUNTER — Other Ambulatory Visit: Payer: Self-pay | Admitting: Cardiology

## 2021-01-31 DIAGNOSIS — S62102A Fracture of unspecified carpal bone, left wrist, initial encounter for closed fracture: Secondary | ICD-10-CM | POA: Diagnosis not present

## 2021-01-31 DIAGNOSIS — S52135A Nondisplaced fracture of neck of left radius, initial encounter for closed fracture: Secondary | ICD-10-CM | POA: Diagnosis not present

## 2021-01-31 DIAGNOSIS — S52592A Other fractures of lower end of left radius, initial encounter for closed fracture: Secondary | ICD-10-CM | POA: Diagnosis not present

## 2021-01-31 DIAGNOSIS — S52502A Unspecified fracture of the lower end of left radius, initial encounter for closed fracture: Secondary | ICD-10-CM | POA: Diagnosis not present

## 2021-01-31 DIAGNOSIS — Z91013 Allergy to seafood: Secondary | ICD-10-CM | POA: Diagnosis not present

## 2021-01-31 DIAGNOSIS — Z882 Allergy status to sulfonamides status: Secondary | ICD-10-CM | POA: Diagnosis not present

## 2021-01-31 DIAGNOSIS — S52615A Nondisplaced fracture of left ulna styloid process, initial encounter for closed fracture: Secondary | ICD-10-CM | POA: Diagnosis not present

## 2021-01-31 DIAGNOSIS — W1839XA Other fall on same level, initial encounter: Secondary | ICD-10-CM | POA: Diagnosis not present

## 2021-02-01 ENCOUNTER — Other Ambulatory Visit (HOSPITAL_COMMUNITY): Payer: Self-pay | Admitting: Internal Medicine

## 2021-02-01 DIAGNOSIS — Z13820 Encounter for screening for osteoporosis: Secondary | ICD-10-CM

## 2021-02-04 DIAGNOSIS — S52592A Other fractures of lower end of left radius, initial encounter for closed fracture: Secondary | ICD-10-CM | POA: Diagnosis not present

## 2021-02-08 DIAGNOSIS — S52592D Other fractures of lower end of left radius, subsequent encounter for closed fracture with routine healing: Secondary | ICD-10-CM | POA: Diagnosis not present

## 2021-02-15 DIAGNOSIS — M25512 Pain in left shoulder: Secondary | ICD-10-CM | POA: Diagnosis not present

## 2021-02-15 DIAGNOSIS — M25522 Pain in left elbow: Secondary | ICD-10-CM | POA: Diagnosis not present

## 2021-02-15 DIAGNOSIS — S52592D Other fractures of lower end of left radius, subsequent encounter for closed fracture with routine healing: Secondary | ICD-10-CM | POA: Diagnosis not present

## 2021-02-17 ENCOUNTER — Other Ambulatory Visit: Payer: Self-pay

## 2021-02-17 ENCOUNTER — Ambulatory Visit (HOSPITAL_COMMUNITY)
Admission: RE | Admit: 2021-02-17 | Discharge: 2021-02-17 | Disposition: A | Discharge status: Home / Self Care | Payer: Medicare Other | Source: Ambulatory Visit | Attending: Internal Medicine | Admitting: Internal Medicine

## 2021-02-17 DIAGNOSIS — Z13820 Encounter for screening for osteoporosis: Secondary | ICD-10-CM | POA: Diagnosis not present

## 2021-02-17 DIAGNOSIS — Z780 Asymptomatic menopausal state: Secondary | ICD-10-CM | POA: Insufficient documentation

## 2021-02-17 DIAGNOSIS — M810 Age-related osteoporosis without current pathological fracture: Secondary | ICD-10-CM | POA: Insufficient documentation

## 2021-02-17 DIAGNOSIS — M8589 Other specified disorders of bone density and structure, multiple sites: Secondary | ICD-10-CM | POA: Diagnosis not present

## 2021-02-23 DIAGNOSIS — M810 Age-related osteoporosis without current pathological fracture: Secondary | ICD-10-CM | POA: Diagnosis not present

## 2021-03-01 DIAGNOSIS — S52502A Unspecified fracture of the lower end of left radius, initial encounter for closed fracture: Secondary | ICD-10-CM | POA: Diagnosis not present

## 2021-03-01 DIAGNOSIS — S52592D Other fractures of lower end of left radius, subsequent encounter for closed fracture with routine healing: Secondary | ICD-10-CM | POA: Diagnosis not present

## 2021-03-01 DIAGNOSIS — S63512A Sprain of carpal joint of left wrist, initial encounter: Secondary | ICD-10-CM | POA: Diagnosis not present

## 2021-03-01 DIAGNOSIS — M19032 Primary osteoarthritis, left wrist: Secondary | ICD-10-CM | POA: Diagnosis not present

## 2021-03-03 DIAGNOSIS — M25642 Stiffness of left hand, not elsewhere classified: Secondary | ICD-10-CM | POA: Diagnosis not present

## 2021-03-03 DIAGNOSIS — M25622 Stiffness of left elbow, not elsewhere classified: Secondary | ICD-10-CM | POA: Diagnosis not present

## 2021-03-03 DIAGNOSIS — M25442 Effusion, left hand: Secondary | ICD-10-CM | POA: Diagnosis not present

## 2021-03-03 DIAGNOSIS — M79642 Pain in left hand: Secondary | ICD-10-CM | POA: Diagnosis not present

## 2021-03-03 DIAGNOSIS — M25532 Pain in left wrist: Secondary | ICD-10-CM | POA: Diagnosis not present

## 2021-03-03 DIAGNOSIS — M25632 Stiffness of left wrist, not elsewhere classified: Secondary | ICD-10-CM | POA: Diagnosis not present

## 2021-03-03 DIAGNOSIS — R531 Weakness: Secondary | ICD-10-CM | POA: Diagnosis not present

## 2021-03-03 DIAGNOSIS — M25432 Effusion, left wrist: Secondary | ICD-10-CM | POA: Diagnosis not present

## 2021-03-05 DIAGNOSIS — M25442 Effusion, left hand: Secondary | ICD-10-CM | POA: Diagnosis not present

## 2021-03-05 DIAGNOSIS — M25632 Stiffness of left wrist, not elsewhere classified: Secondary | ICD-10-CM | POA: Diagnosis not present

## 2021-03-05 DIAGNOSIS — R531 Weakness: Secondary | ICD-10-CM | POA: Diagnosis not present

## 2021-03-05 DIAGNOSIS — M25432 Effusion, left wrist: Secondary | ICD-10-CM | POA: Diagnosis not present

## 2021-03-05 DIAGNOSIS — M25622 Stiffness of left elbow, not elsewhere classified: Secondary | ICD-10-CM | POA: Diagnosis not present

## 2021-03-05 DIAGNOSIS — M25642 Stiffness of left hand, not elsewhere classified: Secondary | ICD-10-CM | POA: Diagnosis not present

## 2021-03-05 DIAGNOSIS — M79642 Pain in left hand: Secondary | ICD-10-CM | POA: Diagnosis not present

## 2021-03-05 DIAGNOSIS — M25532 Pain in left wrist: Secondary | ICD-10-CM | POA: Diagnosis not present

## 2021-03-08 DIAGNOSIS — M79642 Pain in left hand: Secondary | ICD-10-CM | POA: Diagnosis not present

## 2021-03-08 DIAGNOSIS — M25532 Pain in left wrist: Secondary | ICD-10-CM | POA: Diagnosis not present

## 2021-03-08 DIAGNOSIS — M25442 Effusion, left hand: Secondary | ICD-10-CM | POA: Diagnosis not present

## 2021-03-08 DIAGNOSIS — M25432 Effusion, left wrist: Secondary | ICD-10-CM | POA: Diagnosis not present

## 2021-03-08 DIAGNOSIS — R531 Weakness: Secondary | ICD-10-CM | POA: Diagnosis not present

## 2021-03-08 DIAGNOSIS — M25632 Stiffness of left wrist, not elsewhere classified: Secondary | ICD-10-CM | POA: Diagnosis not present

## 2021-03-08 DIAGNOSIS — M25642 Stiffness of left hand, not elsewhere classified: Secondary | ICD-10-CM | POA: Diagnosis not present

## 2021-03-08 DIAGNOSIS — M25622 Stiffness of left elbow, not elsewhere classified: Secondary | ICD-10-CM | POA: Diagnosis not present

## 2021-03-10 DIAGNOSIS — M25432 Effusion, left wrist: Secondary | ICD-10-CM | POA: Diagnosis not present

## 2021-03-10 DIAGNOSIS — M25632 Stiffness of left wrist, not elsewhere classified: Secondary | ICD-10-CM | POA: Diagnosis not present

## 2021-03-10 DIAGNOSIS — M25622 Stiffness of left elbow, not elsewhere classified: Secondary | ICD-10-CM | POA: Diagnosis not present

## 2021-03-10 DIAGNOSIS — M79642 Pain in left hand: Secondary | ICD-10-CM | POA: Diagnosis not present

## 2021-03-10 DIAGNOSIS — M25532 Pain in left wrist: Secondary | ICD-10-CM | POA: Diagnosis not present

## 2021-03-10 DIAGNOSIS — M25642 Stiffness of left hand, not elsewhere classified: Secondary | ICD-10-CM | POA: Diagnosis not present

## 2021-03-10 DIAGNOSIS — M25442 Effusion, left hand: Secondary | ICD-10-CM | POA: Diagnosis not present

## 2021-03-10 DIAGNOSIS — R531 Weakness: Secondary | ICD-10-CM | POA: Diagnosis not present

## 2021-03-12 DIAGNOSIS — M79642 Pain in left hand: Secondary | ICD-10-CM | POA: Diagnosis not present

## 2021-03-12 DIAGNOSIS — M25442 Effusion, left hand: Secondary | ICD-10-CM | POA: Diagnosis not present

## 2021-03-12 DIAGNOSIS — R531 Weakness: Secondary | ICD-10-CM | POA: Diagnosis not present

## 2021-03-12 DIAGNOSIS — M25532 Pain in left wrist: Secondary | ICD-10-CM | POA: Diagnosis not present

## 2021-03-12 DIAGNOSIS — M25622 Stiffness of left elbow, not elsewhere classified: Secondary | ICD-10-CM | POA: Diagnosis not present

## 2021-03-12 DIAGNOSIS — M25642 Stiffness of left hand, not elsewhere classified: Secondary | ICD-10-CM | POA: Diagnosis not present

## 2021-03-12 DIAGNOSIS — M25432 Effusion, left wrist: Secondary | ICD-10-CM | POA: Diagnosis not present

## 2021-03-12 DIAGNOSIS — M25632 Stiffness of left wrist, not elsewhere classified: Secondary | ICD-10-CM | POA: Diagnosis not present

## 2021-03-16 DIAGNOSIS — M79642 Pain in left hand: Secondary | ICD-10-CM | POA: Diagnosis not present

## 2021-03-16 DIAGNOSIS — M25532 Pain in left wrist: Secondary | ICD-10-CM | POA: Diagnosis not present

## 2021-03-16 DIAGNOSIS — R531 Weakness: Secondary | ICD-10-CM | POA: Diagnosis not present

## 2021-03-16 DIAGNOSIS — M25432 Effusion, left wrist: Secondary | ICD-10-CM | POA: Diagnosis not present

## 2021-03-16 DIAGNOSIS — M25622 Stiffness of left elbow, not elsewhere classified: Secondary | ICD-10-CM | POA: Diagnosis not present

## 2021-03-16 DIAGNOSIS — M25632 Stiffness of left wrist, not elsewhere classified: Secondary | ICD-10-CM | POA: Diagnosis not present

## 2021-03-16 DIAGNOSIS — M25642 Stiffness of left hand, not elsewhere classified: Secondary | ICD-10-CM | POA: Diagnosis not present

## 2021-03-16 DIAGNOSIS — M25442 Effusion, left hand: Secondary | ICD-10-CM | POA: Diagnosis not present

## 2021-03-17 DIAGNOSIS — M25622 Stiffness of left elbow, not elsewhere classified: Secondary | ICD-10-CM | POA: Diagnosis not present

## 2021-03-17 DIAGNOSIS — M25632 Stiffness of left wrist, not elsewhere classified: Secondary | ICD-10-CM | POA: Diagnosis not present

## 2021-03-17 DIAGNOSIS — M25532 Pain in left wrist: Secondary | ICD-10-CM | POA: Diagnosis not present

## 2021-03-17 DIAGNOSIS — M79642 Pain in left hand: Secondary | ICD-10-CM | POA: Diagnosis not present

## 2021-03-17 DIAGNOSIS — R531 Weakness: Secondary | ICD-10-CM | POA: Diagnosis not present

## 2021-03-17 DIAGNOSIS — M25642 Stiffness of left hand, not elsewhere classified: Secondary | ICD-10-CM | POA: Diagnosis not present

## 2021-03-17 DIAGNOSIS — M25432 Effusion, left wrist: Secondary | ICD-10-CM | POA: Diagnosis not present

## 2021-03-17 DIAGNOSIS — M25442 Effusion, left hand: Secondary | ICD-10-CM | POA: Diagnosis not present

## 2021-03-19 DIAGNOSIS — M25642 Stiffness of left hand, not elsewhere classified: Secondary | ICD-10-CM | POA: Diagnosis not present

## 2021-03-19 DIAGNOSIS — M25532 Pain in left wrist: Secondary | ICD-10-CM | POA: Diagnosis not present

## 2021-03-19 DIAGNOSIS — M25622 Stiffness of left elbow, not elsewhere classified: Secondary | ICD-10-CM | POA: Diagnosis not present

## 2021-03-19 DIAGNOSIS — M79642 Pain in left hand: Secondary | ICD-10-CM | POA: Diagnosis not present

## 2021-03-19 DIAGNOSIS — M25432 Effusion, left wrist: Secondary | ICD-10-CM | POA: Diagnosis not present

## 2021-03-19 DIAGNOSIS — M25632 Stiffness of left wrist, not elsewhere classified: Secondary | ICD-10-CM | POA: Diagnosis not present

## 2021-03-19 DIAGNOSIS — R531 Weakness: Secondary | ICD-10-CM | POA: Diagnosis not present

## 2021-03-19 DIAGNOSIS — M25442 Effusion, left hand: Secondary | ICD-10-CM | POA: Diagnosis not present

## 2021-03-23 DIAGNOSIS — M25442 Effusion, left hand: Secondary | ICD-10-CM | POA: Diagnosis not present

## 2021-03-23 DIAGNOSIS — M25622 Stiffness of left elbow, not elsewhere classified: Secondary | ICD-10-CM | POA: Diagnosis not present

## 2021-03-23 DIAGNOSIS — R531 Weakness: Secondary | ICD-10-CM | POA: Diagnosis not present

## 2021-03-23 DIAGNOSIS — M79642 Pain in left hand: Secondary | ICD-10-CM | POA: Diagnosis not present

## 2021-03-23 DIAGNOSIS — M25642 Stiffness of left hand, not elsewhere classified: Secondary | ICD-10-CM | POA: Diagnosis not present

## 2021-03-23 DIAGNOSIS — M25532 Pain in left wrist: Secondary | ICD-10-CM | POA: Diagnosis not present

## 2021-03-23 DIAGNOSIS — M25632 Stiffness of left wrist, not elsewhere classified: Secondary | ICD-10-CM | POA: Diagnosis not present

## 2021-03-23 DIAGNOSIS — M25432 Effusion, left wrist: Secondary | ICD-10-CM | POA: Diagnosis not present

## 2021-03-24 DIAGNOSIS — M25632 Stiffness of left wrist, not elsewhere classified: Secondary | ICD-10-CM | POA: Diagnosis not present

## 2021-03-24 DIAGNOSIS — M25442 Effusion, left hand: Secondary | ICD-10-CM | POA: Diagnosis not present

## 2021-03-24 DIAGNOSIS — M25642 Stiffness of left hand, not elsewhere classified: Secondary | ICD-10-CM | POA: Diagnosis not present

## 2021-03-24 DIAGNOSIS — M79642 Pain in left hand: Secondary | ICD-10-CM | POA: Diagnosis not present

## 2021-03-24 DIAGNOSIS — M25622 Stiffness of left elbow, not elsewhere classified: Secondary | ICD-10-CM | POA: Diagnosis not present

## 2021-03-24 DIAGNOSIS — M25432 Effusion, left wrist: Secondary | ICD-10-CM | POA: Diagnosis not present

## 2021-03-24 DIAGNOSIS — M25532 Pain in left wrist: Secondary | ICD-10-CM | POA: Diagnosis not present

## 2021-03-24 DIAGNOSIS — R531 Weakness: Secondary | ICD-10-CM | POA: Diagnosis not present

## 2021-03-26 DIAGNOSIS — M79642 Pain in left hand: Secondary | ICD-10-CM | POA: Diagnosis not present

## 2021-03-26 DIAGNOSIS — R531 Weakness: Secondary | ICD-10-CM | POA: Diagnosis not present

## 2021-03-26 DIAGNOSIS — M25622 Stiffness of left elbow, not elsewhere classified: Secondary | ICD-10-CM | POA: Diagnosis not present

## 2021-03-26 DIAGNOSIS — M25632 Stiffness of left wrist, not elsewhere classified: Secondary | ICD-10-CM | POA: Diagnosis not present

## 2021-03-26 DIAGNOSIS — M25442 Effusion, left hand: Secondary | ICD-10-CM | POA: Diagnosis not present

## 2021-03-26 DIAGNOSIS — M25642 Stiffness of left hand, not elsewhere classified: Secondary | ICD-10-CM | POA: Diagnosis not present

## 2021-03-26 DIAGNOSIS — M25532 Pain in left wrist: Secondary | ICD-10-CM | POA: Diagnosis not present

## 2021-03-26 DIAGNOSIS — M25432 Effusion, left wrist: Secondary | ICD-10-CM | POA: Diagnosis not present

## 2021-03-29 DIAGNOSIS — M25432 Effusion, left wrist: Secondary | ICD-10-CM | POA: Diagnosis not present

## 2021-03-29 DIAGNOSIS — M25622 Stiffness of left elbow, not elsewhere classified: Secondary | ICD-10-CM | POA: Diagnosis not present

## 2021-03-29 DIAGNOSIS — M25632 Stiffness of left wrist, not elsewhere classified: Secondary | ICD-10-CM | POA: Diagnosis not present

## 2021-03-29 DIAGNOSIS — M25532 Pain in left wrist: Secondary | ICD-10-CM | POA: Diagnosis not present

## 2021-03-29 DIAGNOSIS — R531 Weakness: Secondary | ICD-10-CM | POA: Diagnosis not present

## 2021-03-29 DIAGNOSIS — M25642 Stiffness of left hand, not elsewhere classified: Secondary | ICD-10-CM | POA: Diagnosis not present

## 2021-03-29 DIAGNOSIS — M79642 Pain in left hand: Secondary | ICD-10-CM | POA: Diagnosis not present

## 2021-03-29 DIAGNOSIS — M25442 Effusion, left hand: Secondary | ICD-10-CM | POA: Diagnosis not present

## 2021-04-02 ENCOUNTER — Ambulatory Visit (INDEPENDENT_AMBULATORY_CARE_PROVIDER_SITE_OTHER): Payer: Medicare Other | Admitting: Cardiology

## 2021-04-02 ENCOUNTER — Other Ambulatory Visit: Payer: Self-pay

## 2021-04-02 ENCOUNTER — Encounter: Payer: Self-pay | Admitting: Cardiology

## 2021-04-02 VITALS — BP 160/62 | HR 73 | Ht 64.0 in | Wt 180.0 lb

## 2021-04-02 DIAGNOSIS — I10 Essential (primary) hypertension: Secondary | ICD-10-CM | POA: Diagnosis not present

## 2021-04-02 NOTE — Progress Notes (Signed)
Clinical Summary Ms. Brinson is a 80 y.o.female seen today for follow up of the following medical problems.   1. Dizziness/Vertigo  - episode 10/14 while inside while standing on scale. Immeidately with looking down her vision got altered, "swirling white changes". Felt off balance, treid to sit down and fell to floor. Tried to get up but fell back down again. Felt swimmy headed.  - got onto bed, room was spinning, worst with head movement.   - seen in [facility] for dizziness and fall. Treated for vertigo. From pcp notes +dix hall pike maneuvers. Orthostatics negative at pcp office - referred to PT for vertigo exercises   2. HTN - losartan increased to 50mg  bid by pcp just last week - 140-150s SBPs.   - compliant with meds. Norvasc caused some dizziness, has stopped taking - takes losartan 50mg  bid.   Past Medical History:  Diagnosis Date  . Hyperlipidemia   . Hypertension      Allergies  Allergen Reactions  . Shellfish Allergy      Current Outpatient Medications  Medication Sig Dispense Refill  . amLODipine (NORVASC) 5 MG tablet TAKE 1 TABLET BY MOUTH EVERY DAY - MUST FOLLOWUP WITH CARDIOLOGIST FOR MORE REFILLS 30 tablet 0  . aspirin EC 81 MG tablet Take 81 mg by mouth. 2-3 x per week    . losartan (COZAAR) 50 MG tablet Take 50 mg by mouth 2 (two) times daily.     . meclizine (ANTIVERT) 25 MG tablet Take 25 mg by mouth 3 (three) times daily as needed for dizziness.    VITAMIN D PO Take 1 tablet by mouth daily.     No current facility-administered medications for this visit.     Past Surgical History:  Procedure Laterality Date  . APPENDECTOMY       Allergies  Allergen Reactions  . Shellfish Allergy       No family history on file.   Social History Ms. Mearns reports that she has never smoked. She has never used smokeless tobacco. Ms. Lobello reports no history of alcohol use.   Review of Systems CONSTITUTIONAL: No weight loss, fever,  chills, weakness or fatigue.  HEENT: Eyes: No visual loss, blurred vision, double vision or yellow sclerae.No hearing loss, sneezing, congestion, runny nose or sore throat.  SKIN: No rash or itching.  CARDIOVASCULAR: per hpi RESPIRATORY: No shortness of breath, cough or sputum.  GASTROINTESTINAL: No anorexia, nausea, vomiting or diarrhea. No abdominal pain or blood.  GENITOURINARY: No burning on urination, no polyuria NEUROLOGICAL: per hpi MUSCULOSKELETAL: No muscle, back pain, joint pain or stiffness.  LYMPHATICS: No enlarged nodes. No history of splenectomy.  PSYCHIATRIC: No history of depression or anxiety.  ENDOCRINOLOGIC: No reports of sweating, cold or heat intolerance. No polyuria or polydipsia.  Joanne Gavel   Physical Examination Today's Vitals   04/02/21 1036  BP: (!) 160/62  Pulse: 73  SpO2: 96%  Weight: 180 lb (81.6 kg)  Height: 5\' 4"  (1.626 m)   Body mass index is 30.9 kg/m.  Gen: resting comfortably, no acute distress HEENT: no scleral icterus, pupils equal round and reactive, no palptable cervical adenopathy,  CV: RRR, no m/rg, no jvd Resp: Clear to auscultation bilaterally GI: abdomen is soft, non-tender, non-distended, normal bowel sounds, no hepatosplenomegaly MSK: extremities are warm, no edema.  Skin: warm, no rash Neuro:  no focal deficits Psych: appropriate affect    Assessment and Plan    1. HTN - remains above goal. -  reports some dizziness on norvac, will try taking at night to see if better tolerated - will call in week to update [place] on any side effects and also home bp's, may need to change to chlorthalidone pending her report.   EKG today shows NSR  Antoine Poche, M.D.

## 2021-04-02 NOTE — Patient Instructions (Signed)
Your physician recommends that you schedule a follow-up appointment in: 6 MONTHS WITH DR [facility]  Your physician recommends that you continue on your current medications as directed. Please refer to the Current Medication list given to you today.  TAKE AMLODIPINE AT NIGHTTIME   UPDATE [place] IN 1 WEEK WITH YOUR BLOOD PRESSURE READINGS  Thank you for choosing [facility]!!

## 2021-04-05 DIAGNOSIS — S52592D Other fractures of lower end of left radius, subsequent encounter for closed fracture with routine healing: Secondary | ICD-10-CM | POA: Diagnosis not present

## 2021-04-06 DIAGNOSIS — M25642 Stiffness of left hand, not elsewhere classified: Secondary | ICD-10-CM | POA: Diagnosis not present

## 2021-04-06 DIAGNOSIS — M25622 Stiffness of left elbow, not elsewhere classified: Secondary | ICD-10-CM | POA: Diagnosis not present

## 2021-04-06 DIAGNOSIS — M25632 Stiffness of left wrist, not elsewhere classified: Secondary | ICD-10-CM | POA: Diagnosis not present

## 2021-04-06 DIAGNOSIS — R531 Weakness: Secondary | ICD-10-CM | POA: Diagnosis not present

## 2021-04-06 DIAGNOSIS — M25442 Effusion, left hand: Secondary | ICD-10-CM | POA: Diagnosis not present

## 2021-04-06 DIAGNOSIS — M79642 Pain in left hand: Secondary | ICD-10-CM | POA: Diagnosis not present

## 2021-04-06 DIAGNOSIS — M25532 Pain in left wrist: Secondary | ICD-10-CM | POA: Diagnosis not present

## 2021-04-06 DIAGNOSIS — M25432 Effusion, left wrist: Secondary | ICD-10-CM | POA: Diagnosis not present

## 2021-04-13 DIAGNOSIS — M79642 Pain in left hand: Secondary | ICD-10-CM | POA: Diagnosis not present

## 2021-04-13 DIAGNOSIS — M25622 Stiffness of left elbow, not elsewhere classified: Secondary | ICD-10-CM | POA: Diagnosis not present

## 2021-04-13 DIAGNOSIS — M25442 Effusion, left hand: Secondary | ICD-10-CM | POA: Diagnosis not present

## 2021-04-13 DIAGNOSIS — M25432 Effusion, left wrist: Secondary | ICD-10-CM | POA: Diagnosis not present

## 2021-04-13 DIAGNOSIS — R531 Weakness: Secondary | ICD-10-CM | POA: Diagnosis not present

## 2021-04-13 DIAGNOSIS — M25642 Stiffness of left hand, not elsewhere classified: Secondary | ICD-10-CM | POA: Diagnosis not present

## 2021-04-13 DIAGNOSIS — M25532 Pain in left wrist: Secondary | ICD-10-CM | POA: Diagnosis not present

## 2021-04-13 DIAGNOSIS — M25632 Stiffness of left wrist, not elsewhere classified: Secondary | ICD-10-CM | POA: Diagnosis not present

## 2021-04-20 DIAGNOSIS — M25432 Effusion, left wrist: Secondary | ICD-10-CM | POA: Diagnosis not present

## 2021-04-20 DIAGNOSIS — M25532 Pain in left wrist: Secondary | ICD-10-CM | POA: Diagnosis not present

## 2021-04-20 DIAGNOSIS — M25632 Stiffness of left wrist, not elsewhere classified: Secondary | ICD-10-CM | POA: Diagnosis not present

## 2021-04-20 DIAGNOSIS — M25642 Stiffness of left hand, not elsewhere classified: Secondary | ICD-10-CM | POA: Diagnosis not present

## 2021-04-20 DIAGNOSIS — R531 Weakness: Secondary | ICD-10-CM | POA: Diagnosis not present

## 2021-04-20 DIAGNOSIS — M25442 Effusion, left hand: Secondary | ICD-10-CM | POA: Diagnosis not present

## 2021-04-20 DIAGNOSIS — M79642 Pain in left hand: Secondary | ICD-10-CM | POA: Diagnosis not present

## 2021-04-20 DIAGNOSIS — M25622 Stiffness of left elbow, not elsewhere classified: Secondary | ICD-10-CM | POA: Diagnosis not present

## 2021-04-27 DIAGNOSIS — M25632 Stiffness of left wrist, not elsewhere classified: Secondary | ICD-10-CM | POA: Diagnosis not present

## 2021-04-27 DIAGNOSIS — M25642 Stiffness of left hand, not elsewhere classified: Secondary | ICD-10-CM | POA: Diagnosis not present

## 2021-04-27 DIAGNOSIS — M79642 Pain in left hand: Secondary | ICD-10-CM | POA: Diagnosis not present

## 2021-04-27 DIAGNOSIS — M25532 Pain in left wrist: Secondary | ICD-10-CM | POA: Diagnosis not present

## 2021-04-27 DIAGNOSIS — R531 Weakness: Secondary | ICD-10-CM | POA: Diagnosis not present

## 2021-04-27 DIAGNOSIS — M25442 Effusion, left hand: Secondary | ICD-10-CM | POA: Diagnosis not present

## 2021-04-27 DIAGNOSIS — M25622 Stiffness of left elbow, not elsewhere classified: Secondary | ICD-10-CM | POA: Diagnosis not present

## 2021-04-27 DIAGNOSIS — M25432 Effusion, left wrist: Secondary | ICD-10-CM | POA: Diagnosis not present

## 2021-05-04 DIAGNOSIS — M25532 Pain in left wrist: Secondary | ICD-10-CM | POA: Diagnosis not present

## 2021-05-04 DIAGNOSIS — M25432 Effusion, left wrist: Secondary | ICD-10-CM | POA: Diagnosis not present

## 2021-05-04 DIAGNOSIS — M25642 Stiffness of left hand, not elsewhere classified: Secondary | ICD-10-CM | POA: Diagnosis not present

## 2021-05-04 DIAGNOSIS — M79642 Pain in left hand: Secondary | ICD-10-CM | POA: Diagnosis not present

## 2021-05-04 DIAGNOSIS — M25622 Stiffness of left elbow, not elsewhere classified: Secondary | ICD-10-CM | POA: Diagnosis not present

## 2021-05-04 DIAGNOSIS — M25632 Stiffness of left wrist, not elsewhere classified: Secondary | ICD-10-CM | POA: Diagnosis not present

## 2021-05-04 DIAGNOSIS — M25442 Effusion, left hand: Secondary | ICD-10-CM | POA: Diagnosis not present

## 2021-05-04 DIAGNOSIS — R531 Weakness: Secondary | ICD-10-CM | POA: Diagnosis not present

## 2021-05-11 DIAGNOSIS — M79642 Pain in left hand: Secondary | ICD-10-CM | POA: Diagnosis not present

## 2021-05-11 DIAGNOSIS — M25432 Effusion, left wrist: Secondary | ICD-10-CM | POA: Diagnosis not present

## 2021-05-11 DIAGNOSIS — M25622 Stiffness of left elbow, not elsewhere classified: Secondary | ICD-10-CM | POA: Diagnosis not present

## 2021-05-11 DIAGNOSIS — M25442 Effusion, left hand: Secondary | ICD-10-CM | POA: Diagnosis not present

## 2021-05-11 DIAGNOSIS — M25642 Stiffness of left hand, not elsewhere classified: Secondary | ICD-10-CM | POA: Diagnosis not present

## 2021-05-11 DIAGNOSIS — M25632 Stiffness of left wrist, not elsewhere classified: Secondary | ICD-10-CM | POA: Diagnosis not present

## 2021-05-11 DIAGNOSIS — R531 Weakness: Secondary | ICD-10-CM | POA: Diagnosis not present

## 2021-05-11 DIAGNOSIS — M25532 Pain in left wrist: Secondary | ICD-10-CM | POA: Diagnosis not present

## 2021-05-17 DIAGNOSIS — M2560 Stiffness of unspecified joint, not elsewhere classified: Secondary | ICD-10-CM | POA: Diagnosis not present

## 2021-05-17 DIAGNOSIS — Z7409 Other reduced mobility: Secondary | ICD-10-CM | POA: Diagnosis not present

## 2021-05-17 DIAGNOSIS — S52502D Unspecified fracture of the lower end of left radius, subsequent encounter for closed fracture with routine healing: Secondary | ICD-10-CM | POA: Diagnosis not present

## 2021-05-25 DIAGNOSIS — M25432 Effusion, left wrist: Secondary | ICD-10-CM | POA: Diagnosis not present

## 2021-05-25 DIAGNOSIS — M25442 Effusion, left hand: Secondary | ICD-10-CM | POA: Diagnosis not present

## 2021-05-25 DIAGNOSIS — M79642 Pain in left hand: Secondary | ICD-10-CM | POA: Diagnosis not present

## 2021-05-25 DIAGNOSIS — R531 Weakness: Secondary | ICD-10-CM | POA: Diagnosis not present

## 2021-05-25 DIAGNOSIS — M25632 Stiffness of left wrist, not elsewhere classified: Secondary | ICD-10-CM | POA: Diagnosis not present

## 2021-05-25 DIAGNOSIS — M25532 Pain in left wrist: Secondary | ICD-10-CM | POA: Diagnosis not present

## 2021-05-25 DIAGNOSIS — M25622 Stiffness of left elbow, not elsewhere classified: Secondary | ICD-10-CM | POA: Diagnosis not present

## 2021-05-25 DIAGNOSIS — M25642 Stiffness of left hand, not elsewhere classified: Secondary | ICD-10-CM | POA: Diagnosis not present

## 2021-07-05 DIAGNOSIS — M2560 Stiffness of unspecified joint, not elsewhere classified: Secondary | ICD-10-CM | POA: Diagnosis not present

## 2021-07-05 DIAGNOSIS — Z7409 Other reduced mobility: Secondary | ICD-10-CM | POA: Diagnosis not present

## 2021-07-12 DIAGNOSIS — I10 Essential (primary) hypertension: Secondary | ICD-10-CM | POA: Diagnosis not present

## 2021-07-12 DIAGNOSIS — E538 Deficiency of other specified B group vitamins: Secondary | ICD-10-CM | POA: Diagnosis not present

## 2021-07-12 DIAGNOSIS — R7301 Impaired fasting glucose: Secondary | ICD-10-CM | POA: Diagnosis not present

## 2021-07-12 DIAGNOSIS — D509 Iron deficiency anemia, unspecified: Secondary | ICD-10-CM | POA: Diagnosis not present

## 2021-07-12 DIAGNOSIS — E559 Vitamin D deficiency, unspecified: Secondary | ICD-10-CM | POA: Diagnosis not present

## 2021-07-20 DIAGNOSIS — L03211 Cellulitis of face: Secondary | ICD-10-CM | POA: Diagnosis not present

## 2021-07-20 DIAGNOSIS — H6121 Impacted cerumen, right ear: Secondary | ICD-10-CM | POA: Diagnosis not present

## 2021-07-26 DIAGNOSIS — H6121 Impacted cerumen, right ear: Secondary | ICD-10-CM | POA: Diagnosis not present

## 2021-07-26 DIAGNOSIS — L03211 Cellulitis of face: Secondary | ICD-10-CM | POA: Diagnosis not present

## 2021-07-26 DIAGNOSIS — E118 Type 2 diabetes mellitus with unspecified complications: Secondary | ICD-10-CM | POA: Diagnosis not present

## 2021-07-26 DIAGNOSIS — E782 Mixed hyperlipidemia: Secondary | ICD-10-CM | POA: Diagnosis not present

## 2021-07-26 DIAGNOSIS — M79672 Pain in left foot: Secondary | ICD-10-CM | POA: Diagnosis not present

## 2021-07-26 DIAGNOSIS — I10 Essential (primary) hypertension: Secondary | ICD-10-CM | POA: Diagnosis not present

## 2021-07-26 DIAGNOSIS — Z0001 Encounter for general adult medical examination with abnormal findings: Secondary | ICD-10-CM | POA: Diagnosis not present

## 2021-07-26 DIAGNOSIS — E538 Deficiency of other specified B group vitamins: Secondary | ICD-10-CM | POA: Diagnosis not present

## 2021-07-26 DIAGNOSIS — R42 Dizziness and giddiness: Secondary | ICD-10-CM | POA: Diagnosis not present

## 2021-07-26 DIAGNOSIS — M810 Age-related osteoporosis without current pathological fracture: Secondary | ICD-10-CM | POA: Diagnosis not present

## 2021-07-26 DIAGNOSIS — E559 Vitamin D deficiency, unspecified: Secondary | ICD-10-CM | POA: Diagnosis not present

## 2021-07-28 DIAGNOSIS — D519 Vitamin B12 deficiency anemia, unspecified: Secondary | ICD-10-CM | POA: Diagnosis not present

## 2021-09-17 DIAGNOSIS — E118 Type 2 diabetes mellitus with unspecified complications: Secondary | ICD-10-CM | POA: Diagnosis not present

## 2021-09-17 DIAGNOSIS — R42 Dizziness and giddiness: Secondary | ICD-10-CM | POA: Diagnosis not present

## 2021-09-17 DIAGNOSIS — E559 Vitamin D deficiency, unspecified: Secondary | ICD-10-CM | POA: Diagnosis not present

## 2021-09-17 DIAGNOSIS — E538 Deficiency of other specified B group vitamins: Secondary | ICD-10-CM | POA: Diagnosis not present

## 2021-09-17 DIAGNOSIS — I10 Essential (primary) hypertension: Secondary | ICD-10-CM | POA: Diagnosis not present

## 2021-09-17 DIAGNOSIS — H6121 Impacted cerumen, right ear: Secondary | ICD-10-CM | POA: Diagnosis not present

## 2021-09-17 DIAGNOSIS — M810 Age-related osteoporosis without current pathological fracture: Secondary | ICD-10-CM | POA: Diagnosis not present

## 2021-09-17 DIAGNOSIS — M79672 Pain in left foot: Secondary | ICD-10-CM | POA: Diagnosis not present

## 2021-09-17 DIAGNOSIS — E782 Mixed hyperlipidemia: Secondary | ICD-10-CM | POA: Diagnosis not present

## 2021-10-04 DIAGNOSIS — H6121 Impacted cerumen, right ear: Secondary | ICD-10-CM | POA: Diagnosis not present

## 2021-10-11 ENCOUNTER — Ambulatory Visit: Payer: Medicare Other | Admitting: Cardiology

## 2021-10-11 ENCOUNTER — Encounter: Payer: Self-pay | Admitting: Cardiology

## 2021-10-11 VITALS — BP 160/60 | HR 75 | Ht 63.0 in | Wt 178.4 lb

## 2021-10-11 DIAGNOSIS — R42 Dizziness and giddiness: Secondary | ICD-10-CM | POA: Diagnosis not present

## 2021-10-11 DIAGNOSIS — E782 Mixed hyperlipidemia: Secondary | ICD-10-CM

## 2021-10-11 DIAGNOSIS — I10 Essential (primary) hypertension: Secondary | ICD-10-CM

## 2021-10-11 NOTE — Progress Notes (Signed)
Clinical Summary Ms. Strohmeyer is a 80 y.o.female seen today for follow up of the following medical problems.    1. Dizziness/Vertigo   - episode 10/14 while inside while standing on scale. Immeidately with looking down her vision got altered, "swirling white changes". Felt off balance, treid to sit down and fell to floor. Tried to get up but fell back down again. Felt swimmy headed.  - got onto bed, room was spinning, worst with head movement.    - seen in [facility] for dizziness and fall. Treated for vertigo. From pcp notes +dix hall pike maneuvers. Orthostatics negative at pcp office - referred to PT for vertigo exercises  - reports no significant symptoms of dizziness recently.      2. HTN - losartan increased to 50mg  bid by pcp just last week - 140-150s SBPs.    - compliant with meds. Norvasc caused some dizziness, has stopped taking - takes losartan 50mg  bid.    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     aspirin EC 81 MG tablet Take 81 mg by mouth. 2-3 x per week     losartan (COZAAR) 50 MG tablet Take 50 mg by mouth 2 (two) times daily.      meclizine (ANTIVERT) 25 MG tablet Take 25 mg by mouth 3 (three) times daily as needed for dizziness.     VITAMIN D PO Take 1 tablet by mouth daily.     No current facility-administered medications for this visit.     Past Surgical History:  Procedure Laterality Date   APPENDECTOMY       Allergies  Allergen Reactions   Shellfish Allergy       No family history on  file.   Social History Ms. Monaco reports that she has never smoked. She has never used smokeless tobacco. Ms. Taves reports no history of alcohol use.   Review of Systems CONSTITUTIONAL: No weight loss, fever, chills, weakness or fatigue.  HEENT: Eyes: No visual loss, blurred vision, double vision or yellow sclerae.No hearing loss, sneezing, congestion, runny nose or sore throat.  SKIN: No rash or itching.  CARDIOVASCULAR: per hpi RESPIRATORY: No shortness of breath, cough or sputum.  GASTROINTESTINAL: No anorexia, nausea, vomiting or diarrhea. No abdominal pain or blood.  GENITOURINARY: No burning on urination, no polyuria NEUROLOGICAL: No headache, dizziness, syncope, paralysis, ataxia, numbness or tingling in the extremities. No change in bowel or bladder control.  MUSCULOSKELETAL: No muscle, back pain, joint pain or stiffness.  LYMPHATICS: No enlarged nodes. No history of splenectomy.  PSYCHIATRIC: No history of depression or anxiety.  ENDOCRINOLOGIC: No reports of sweating, cold or heat intolerance. No polyuria or polydipsia.  Joanne Gavel   Physical Examination  Gen: resting comfortably, no acute distress HEENT: no scleral icterus, pupils equal round and reactive, no palptable cervical adenopathy,  CV Resp: Clear to auscultation bilaterally GI: abdomen is soft, non-tender, non-distended, normal bowel sounds, no hepatosplenomegaly MSK: extremities are warm,  no edema.  Skin: warm, no rash Neuro:  no focal deficits Psych: appropriate affect   Diagnostic Studies     Assessment and Plan  1. HTN - elevated today, has not taken meds yet - home SBPs in the 150s. Given her advanced age. Medication side effects,  and prior issues with chronic dizziness higher bp target is reasonable for her. Would accept SBPs in 150s or less - continue current meds  2. Dizziness - no significant recent issues, continue to monitor  3. Hyperlipidemia - continue pravastatin, labs followed by  pcp    Antoine Poche, M.D.

## 2021-10-11 NOTE — Patient Instructions (Signed)

## 2021-10-15 DIAGNOSIS — E538 Deficiency of other specified B group vitamins: Secondary | ICD-10-CM | POA: Diagnosis not present

## 2021-11-12 DIAGNOSIS — E538 Deficiency of other specified B group vitamins: Secondary | ICD-10-CM | POA: Diagnosis not present

## 2021-11-22 DIAGNOSIS — E782 Mixed hyperlipidemia: Secondary | ICD-10-CM | POA: Diagnosis not present

## 2021-11-22 DIAGNOSIS — E559 Vitamin D deficiency, unspecified: Secondary | ICD-10-CM | POA: Diagnosis not present

## 2021-11-22 DIAGNOSIS — E538 Deficiency of other specified B group vitamins: Secondary | ICD-10-CM | POA: Diagnosis not present

## 2021-11-22 DIAGNOSIS — E118 Type 2 diabetes mellitus with unspecified complications: Secondary | ICD-10-CM | POA: Diagnosis not present

## 2021-11-22 DIAGNOSIS — Z23 Encounter for immunization: Secondary | ICD-10-CM | POA: Diagnosis not present

## 2021-11-25 DIAGNOSIS — E118 Type 2 diabetes mellitus with unspecified complications: Secondary | ICD-10-CM | POA: Diagnosis not present

## 2021-11-25 DIAGNOSIS — M79672 Pain in left foot: Secondary | ICD-10-CM | POA: Diagnosis not present

## 2021-11-25 DIAGNOSIS — E782 Mixed hyperlipidemia: Secondary | ICD-10-CM | POA: Diagnosis not present

## 2021-11-25 DIAGNOSIS — M810 Age-related osteoporosis without current pathological fracture: Secondary | ICD-10-CM | POA: Diagnosis not present

## 2021-11-25 DIAGNOSIS — E538 Deficiency of other specified B group vitamins: Secondary | ICD-10-CM | POA: Diagnosis not present

## 2021-11-25 DIAGNOSIS — R809 Proteinuria, unspecified: Secondary | ICD-10-CM | POA: Diagnosis not present

## 2021-11-25 DIAGNOSIS — I10 Essential (primary) hypertension: Secondary | ICD-10-CM | POA: Diagnosis not present

## 2021-11-25 DIAGNOSIS — E559 Vitamin D deficiency, unspecified: Secondary | ICD-10-CM | POA: Diagnosis not present

## 2021-11-25 DIAGNOSIS — H6121 Impacted cerumen, right ear: Secondary | ICD-10-CM | POA: Diagnosis not present

## 2021-11-25 DIAGNOSIS — R42 Dizziness and giddiness: Secondary | ICD-10-CM | POA: Diagnosis not present

## 2021-12-10 DIAGNOSIS — D519 Vitamin B12 deficiency anemia, unspecified: Secondary | ICD-10-CM | POA: Diagnosis not present

## 2022-01-04 DIAGNOSIS — D519 Vitamin B12 deficiency anemia, unspecified: Secondary | ICD-10-CM | POA: Diagnosis not present

## 2022-01-04 DIAGNOSIS — E538 Deficiency of other specified B group vitamins: Secondary | ICD-10-CM | POA: Diagnosis not present

## 2022-01-28 DIAGNOSIS — E538 Deficiency of other specified B group vitamins: Secondary | ICD-10-CM | POA: Diagnosis not present

## 2022-01-28 DIAGNOSIS — D519 Vitamin B12 deficiency anemia, unspecified: Secondary | ICD-10-CM | POA: Diagnosis not present

## 2022-02-25 DIAGNOSIS — D519 Vitamin B12 deficiency anemia, unspecified: Secondary | ICD-10-CM | POA: Diagnosis not present

## 2022-03-23 DIAGNOSIS — E118 Type 2 diabetes mellitus with unspecified complications: Secondary | ICD-10-CM | POA: Diagnosis not present

## 2022-03-23 DIAGNOSIS — E782 Mixed hyperlipidemia: Secondary | ICD-10-CM | POA: Diagnosis not present

## 2022-03-28 DIAGNOSIS — E559 Vitamin D deficiency, unspecified: Secondary | ICD-10-CM | POA: Diagnosis not present

## 2022-03-28 DIAGNOSIS — E1159 Type 2 diabetes mellitus with other circulatory complications: Secondary | ICD-10-CM | POA: Diagnosis not present

## 2022-03-28 DIAGNOSIS — I10 Essential (primary) hypertension: Secondary | ICD-10-CM | POA: Diagnosis not present

## 2022-03-28 DIAGNOSIS — H6121 Impacted cerumen, right ear: Secondary | ICD-10-CM | POA: Diagnosis not present

## 2022-03-28 DIAGNOSIS — E538 Deficiency of other specified B group vitamins: Secondary | ICD-10-CM | POA: Diagnosis not present

## 2022-03-28 DIAGNOSIS — E1169 Type 2 diabetes mellitus with other specified complication: Secondary | ICD-10-CM | POA: Diagnosis not present

## 2022-03-28 DIAGNOSIS — E118 Type 2 diabetes mellitus with unspecified complications: Secondary | ICD-10-CM | POA: Diagnosis not present

## 2022-03-28 DIAGNOSIS — E669 Obesity, unspecified: Secondary | ICD-10-CM | POA: Diagnosis not present

## 2022-03-28 DIAGNOSIS — E782 Mixed hyperlipidemia: Secondary | ICD-10-CM | POA: Diagnosis not present

## 2022-03-28 DIAGNOSIS — M79672 Pain in left foot: Secondary | ICD-10-CM | POA: Diagnosis not present

## 2022-03-28 DIAGNOSIS — R42 Dizziness and giddiness: Secondary | ICD-10-CM | POA: Diagnosis not present

## 2022-04-29 ENCOUNTER — Telehealth: Payer: Self-pay | Admitting: Cardiology

## 2022-04-29 MED ORDER — THERA VITAL M PO TABS: 1.0000 | ORAL_TABLET | Freq: Every day | ORAL | Status: DC

## 2022-04-29 MED ORDER — AMLODIPINE BESYLATE 2.5 MG PO TABS: 2.5000 mg | ORAL_TABLET | Freq: Every day | ORAL | Status: DC

## 2022-04-29 NOTE — Telephone Encounter (Signed)
Patient last seen 10/11/2021 - was given 1 year recall.  Last OV note mentions that her Norvasc was stopped due to dizziness.  She states she saw her pcp in January 2023 and the Norvasc was restarted at 2.5mg  daily.  States that she can not take it on the weekend - makes her soo sleepy that she falls asleep at church.  States that she does skip it here or there due to the sleepiness.  She states that her BP has been running 135/70's and HR running 60-70's.  States that she never felt like this before taking the Norvasc.  Explained to patient that provider my defer back to pcp since he restarted this medication.  Patient stated that she wanted to discuss with her cardiologist to make sure about medications she needed to be on.

## 2022-04-29 NOTE — Telephone Encounter (Signed)
Pt c/o medication issue:  1. Name of Medication: amLODipine (NORVASC) 2.5 MG tablet   2. How are you currently taking this medication (dosage and times per day)? Once daily  3. Are you having a reaction (difficulty breathing--STAT)?   4. What is your medication issue? Patient states medication is making her very sleeply, she is sleeping an extra two hours in the morning. If she sits down during the day she will fall asleep.

## 2022-05-03 DIAGNOSIS — E538 Deficiency of other specified B group vitamins: Secondary | ICD-10-CM | POA: Diagnosis not present

## 2022-05-03 NOTE — Telephone Encounter (Signed)
Stop norvasc, come in 1 week for nursing visit for bp check. May need to consider an alternative bp medication pending bp's.    Dominga Ferry MD

## 2022-05-03 NOTE — Telephone Encounter (Signed)
Patient notified and verbalized understanding. 

## 2022-05-10 ENCOUNTER — Telehealth: Payer: Self-pay | Admitting: Cardiology

## 2022-05-10 ENCOUNTER — Ambulatory Visit: Payer: Medicare HMO | Admitting: *Deleted

## 2022-05-10 VITALS — BP 138/72 | HR 79 | Wt 182.2 lb

## 2022-05-10 DIAGNOSIS — I10 Essential (primary) hypertension: Secondary | ICD-10-CM

## 2022-05-10 NOTE — Telephone Encounter (Signed)
Patient states she was returning call. Please advise  

## 2022-05-10 NOTE — Progress Notes (Signed)
Patient in office for BP check - 138/72  79.    Medication list updated.  States that she is feeling better off of the Norvasc and not having all the sleepiness episodes during the day like before.  Stated that she does have a lot of stress at home with a son that has Parkinson's & a brother that is also sick.    Also states that she has not taken her Benicar yet this morning because she wasn't sure if she was supposed to or not.    Patient states that she rarely takes the ASA 81mg  and questions if she needs to be taking this daily,

## 2022-05-10 NOTE — Progress Notes (Signed)
Can stop aspirin. Can stay off norvasc, I think bp's are ok for now off of it. Continue benicar  Dominga Ferry MD

## 2022-05-10 NOTE — Progress Notes (Signed)
Left message to return call 

## 2022-05-11 NOTE — Telephone Encounter (Signed)
Antoine Poche, MD at 05/10/2022  9:00 AM  Status: Signed  Can stop aspirin. Can stay off norvasc, I think bp's are ok for now off of it. Continue benicar   Dominga Ferry MD     Patient informed and verbalized understanding of plan.

## 2022-05-16 NOTE — Progress Notes (Signed)
Patient notified and verbalized understanding. 

## 2022-05-24 DIAGNOSIS — E538 Deficiency of other specified B group vitamins: Secondary | ICD-10-CM | POA: Diagnosis not present

## 2022-05-24 DIAGNOSIS — R079 Chest pain, unspecified: Secondary | ICD-10-CM | POA: Diagnosis not present

## 2022-05-24 DIAGNOSIS — R059 Cough, unspecified: Secondary | ICD-10-CM | POA: Diagnosis not present

## 2022-05-24 DIAGNOSIS — H6691 Otitis media, unspecified, right ear: Secondary | ICD-10-CM | POA: Diagnosis not present

## 2022-06-24 DIAGNOSIS — E538 Deficiency of other specified B group vitamins: Secondary | ICD-10-CM | POA: Diagnosis not present

## 2022-06-24 DIAGNOSIS — I10 Essential (primary) hypertension: Secondary | ICD-10-CM | POA: Diagnosis not present

## 2022-06-27 ENCOUNTER — Other Ambulatory Visit (HOSPITAL_COMMUNITY): Payer: Self-pay | Admitting: Family Medicine

## 2022-06-27 DIAGNOSIS — Z13820 Encounter for screening for osteoporosis: Secondary | ICD-10-CM

## 2022-06-27 DIAGNOSIS — Z1231 Encounter for screening mammogram for malignant neoplasm of breast: Secondary | ICD-10-CM

## 2022-06-27 DIAGNOSIS — I10 Essential (primary) hypertension: Secondary | ICD-10-CM | POA: Diagnosis not present

## 2022-06-27 DIAGNOSIS — Z9181 History of falling: Secondary | ICD-10-CM | POA: Diagnosis not present

## 2022-07-11 ENCOUNTER — Ambulatory Visit (HOSPITAL_COMMUNITY): Payer: Medicare HMO

## 2022-07-15 ENCOUNTER — Ambulatory Visit (HOSPITAL_COMMUNITY)
Admission: RE | Admit: 2022-07-15 | Discharge: 2022-07-15 | Disposition: A | Discharge status: Home / Self Care | Payer: Medicare HMO | Source: Ambulatory Visit | Attending: Family Medicine | Admitting: Family Medicine

## 2022-07-15 DIAGNOSIS — Z1231 Encounter for screening mammogram for malignant neoplasm of breast: Secondary | ICD-10-CM | POA: Diagnosis not present

## 2022-07-15 DIAGNOSIS — E538 Deficiency of other specified B group vitamins: Secondary | ICD-10-CM | POA: Diagnosis not present

## 2022-07-18 DIAGNOSIS — H524 Presbyopia: Secondary | ICD-10-CM | POA: Diagnosis not present

## 2022-07-18 DIAGNOSIS — H43393 Other vitreous opacities, bilateral: Secondary | ICD-10-CM | POA: Diagnosis not present

## 2022-07-19 ENCOUNTER — Other Ambulatory Visit (HOSPITAL_COMMUNITY): Payer: Self-pay | Admitting: Nurse Practitioner

## 2022-07-19 ENCOUNTER — Other Ambulatory Visit: Payer: Self-pay | Admitting: Nurse Practitioner

## 2022-07-19 DIAGNOSIS — S0990XD Unspecified injury of head, subsequent encounter: Secondary | ICD-10-CM | POA: Diagnosis not present

## 2022-07-19 DIAGNOSIS — R519 Headache, unspecified: Secondary | ICD-10-CM | POA: Diagnosis not present

## 2022-07-19 DIAGNOSIS — M6281 Muscle weakness (generalized): Secondary | ICD-10-CM | POA: Diagnosis not present

## 2022-07-19 DIAGNOSIS — R296 Repeated falls: Secondary | ICD-10-CM | POA: Diagnosis not present

## 2022-07-19 DIAGNOSIS — I10 Essential (primary) hypertension: Secondary | ICD-10-CM | POA: Diagnosis not present

## 2022-07-19 DIAGNOSIS — H547 Unspecified visual loss: Secondary | ICD-10-CM | POA: Diagnosis not present

## 2022-07-22 ENCOUNTER — Ambulatory Visit (HOSPITAL_COMMUNITY)
Admission: RE | Admit: 2022-07-22 | Discharge: 2022-07-22 | Disposition: A | Discharge status: Home / Self Care | Payer: Medicare HMO | Source: Ambulatory Visit | Attending: Nurse Practitioner | Admitting: Nurse Practitioner

## 2022-07-22 DIAGNOSIS — Z043 Encounter for examination and observation following other accident: Secondary | ICD-10-CM | POA: Diagnosis not present

## 2022-07-22 DIAGNOSIS — S0990XD Unspecified injury of head, subsequent encounter: Secondary | ICD-10-CM | POA: Insufficient documentation

## 2022-07-22 DIAGNOSIS — G319 Degenerative disease of nervous system, unspecified: Secondary | ICD-10-CM | POA: Diagnosis not present

## 2022-07-27 DIAGNOSIS — E559 Vitamin D deficiency, unspecified: Secondary | ICD-10-CM | POA: Diagnosis not present

## 2022-07-27 DIAGNOSIS — E538 Deficiency of other specified B group vitamins: Secondary | ICD-10-CM | POA: Diagnosis not present

## 2022-07-27 DIAGNOSIS — E118 Type 2 diabetes mellitus with unspecified complications: Secondary | ICD-10-CM | POA: Diagnosis not present

## 2022-07-27 DIAGNOSIS — E782 Mixed hyperlipidemia: Secondary | ICD-10-CM | POA: Diagnosis not present

## 2022-08-03 DIAGNOSIS — E559 Vitamin D deficiency, unspecified: Secondary | ICD-10-CM | POA: Diagnosis not present

## 2022-08-03 DIAGNOSIS — E782 Mixed hyperlipidemia: Secondary | ICD-10-CM | POA: Diagnosis not present

## 2022-08-03 DIAGNOSIS — E538 Deficiency of other specified B group vitamins: Secondary | ICD-10-CM | POA: Diagnosis not present

## 2022-08-03 DIAGNOSIS — R42 Dizziness and giddiness: Secondary | ICD-10-CM | POA: Diagnosis not present

## 2022-08-03 DIAGNOSIS — M79672 Pain in left foot: Secondary | ICD-10-CM | POA: Diagnosis not present

## 2022-08-03 DIAGNOSIS — E669 Obesity, unspecified: Secondary | ICD-10-CM | POA: Diagnosis not present

## 2022-08-03 DIAGNOSIS — I10 Essential (primary) hypertension: Secondary | ICD-10-CM | POA: Diagnosis not present

## 2022-08-03 DIAGNOSIS — E118 Type 2 diabetes mellitus with unspecified complications: Secondary | ICD-10-CM | POA: Diagnosis not present

## 2022-08-03 DIAGNOSIS — Z0001 Encounter for general adult medical examination with abnormal findings: Secondary | ICD-10-CM | POA: Diagnosis not present

## 2022-08-12 DIAGNOSIS — Z23 Encounter for immunization: Secondary | ICD-10-CM | POA: Diagnosis not present

## 2022-08-12 DIAGNOSIS — E538 Deficiency of other specified B group vitamins: Secondary | ICD-10-CM | POA: Diagnosis not present

## 2022-08-17 DIAGNOSIS — H2513 Age-related nuclear cataract, bilateral: Secondary | ICD-10-CM | POA: Diagnosis not present

## 2022-09-02 DIAGNOSIS — Z6831 Body mass index (BMI) 31.0-31.9, adult: Secondary | ICD-10-CM | POA: Diagnosis not present

## 2022-09-02 DIAGNOSIS — E669 Obesity, unspecified: Secondary | ICD-10-CM | POA: Diagnosis not present

## 2022-09-05 DIAGNOSIS — E538 Deficiency of other specified B group vitamins: Secondary | ICD-10-CM | POA: Diagnosis not present

## 2022-09-09 DIAGNOSIS — H2511 Age-related nuclear cataract, right eye: Secondary | ICD-10-CM | POA: Diagnosis not present

## 2022-09-09 DIAGNOSIS — H25811 Combined forms of age-related cataract, right eye: Secondary | ICD-10-CM | POA: Diagnosis not present

## 2022-10-04 DIAGNOSIS — E538 Deficiency of other specified B group vitamins: Secondary | ICD-10-CM | POA: Diagnosis not present

## 2022-10-11 ENCOUNTER — Encounter: Payer: Self-pay | Admitting: Cardiology

## 2022-10-11 ENCOUNTER — Ambulatory Visit: Payer: Medicare HMO | Attending: Cardiology | Admitting: Cardiology

## 2022-10-11 VITALS — BP 130/62 | HR 86 | Ht 64.0 in | Wt 174.4 lb

## 2022-10-11 DIAGNOSIS — E782 Mixed hyperlipidemia: Secondary | ICD-10-CM

## 2022-10-11 DIAGNOSIS — I10 Essential (primary) hypertension: Secondary | ICD-10-CM

## 2022-10-11 DIAGNOSIS — R42 Dizziness and giddiness: Secondary | ICD-10-CM | POA: Diagnosis not present

## 2022-10-11 NOTE — Patient Instructions (Signed)

## 2022-10-11 NOTE — Progress Notes (Signed)
Clinical Summary Ms. Harshman is a 81 y.o.femaleseen today for follow up of the following medical problems.    1. Dizziness/Vertigo   - episode 10/14 while inside while standing on scale. Immeidately with looking down her vision got altered, "swirling white changes". Felt off balance, treid to sit down and fell to floor. Tried to get up but fell back down again. Felt swimmy headed.  - got onto bed, room was spinning, worst with head movement.    - seen in [facility] for dizziness and fall. Treated for vertigo. From pcp notes +dix hall pike maneuvers. Orthostatics negative at pcp office - referred to PT for vertigo exercises   - reports no significant symptoms of dizziness recently.  - has meclizine as needed, has not needed to use   2. HTN - losartan increased to 50mg  bid by pcp just last week - 140-150s SBPs.    - compliant with meds. Norvasc caused some dizziness, has stopped taking   -norvasc even at low doses caused dizziness, fatigue.  - no recent symptoms.  - pcp started HCTZ 12.5mg  daily.        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 INTOLERANCE      Current Outpatient Medications  Medication Sig Dispense Refill   alendronate (FOSAMAX) 70 MG tablet Take 70 mg by mouth once a week. Sunday am     Cholecalciferol (VITAMIN D3) 50 MCG (2000 UT) TABS Take 1 tablet by mouth every morning.     cyanocobalamin (,VITAMIN B-12,) 1000 MCG/ML injection SMARTSIG:1 Milliliter(s) Topical Once a Month     meclizine (ANTIVERT) 25 MG tablet Take 25 mg by mouth 3 (three) times daily as needed for dizziness.     Multiple Vitamin (MULTIVITAMIN) tablet Take 1 tablet by mouth  every morning.     olmesartan (BENICAR) 40 MG tablet Take 40 mg by mouth every morning.     pravastatin (PRAVACHOL) 40 MG tablet Take 40 mg by mouth every evening.     Vitamin D, Ergocalciferol, (DRISDOL) 1.25 MG (50000 UNIT) CAPS capsule Take 50,000 Units by mouth once a week. Monday am     No current facility-administered medications for this visit.     Past Surgical History:  Procedure Laterality Date   APPENDECTOMY       Allergies  Allergen Reactions   Shellfish Allergy    Tramadol Itching    Patient states that it makes her dizzy and she feels as if she can't walk when she takes this medication so she would rather not take it. INTOLERANCE       No family history on file.   Social History Ms. Lupien reports that she has never smoked. She has never used smokeless tobacco. Ms. Brasel reports no history of alcohol use.   Review of Systems CONSTITUTIONAL: No weight loss, fever, chills, weakness or fatigue.  HEENT: Eyes: No visual loss, blurred vision, double vision or yellow sclerae.No hearing loss, sneezing, congestion, runny nose or sore throat.  SKIN: No rash or itching.  CARDIOVASCULAR: per hpi RESPIRATORY: No shortness of breath, cough or sputum.  GASTROINTESTINAL: No anorexia, nausea, vomiting or diarrhea. No abdominal pain or blood.  GENITOURINARY: No burning on urination, no polyuria NEUROLOGICAL: No headache, dizziness, syncope, paralysis, ataxia, numbness or tingling in the extremities. No change in bowel or bladder control.  MUSCULOSKELETAL: No muscle, back pain, joint pain or stiffness.  LYMPHATICS: No enlarged nodes. No history of splenectomy.  PSYCHIATRIC: No history of depression or anxiety.  ENDOCRINOLOGIC: No reports of sweating, cold or heat intolerance. No polyuria or polydipsia.  Marland Kitchen   Physical Examination Today's Vitals   10/11/22 1302  BP: 130/62  Pulse: 86  SpO2: 95%  Weight: 174 lb 6.4 oz (79.1 kg)  Height: 5\' 4"  (1.626 m)   Body mass  index is 29.94 kg/m.    Gen: resting comfortably, no acute distress HEENT: no scleral icterus, pupils equal round and reactive, no palptable cervical adenopathy,  CV; RRR, no m/rg, no jvd Resp: Clear to auscultation bilaterally GI: abdomen is soft, non-tender, non-distended, normal bowel sounds, no hepatosplenomegaly MSK: extremities are warm, no edema.  Skin: warm, no rash Neuro:  no focal deficits Psych: appropriate affect   Diagnostic Studies     Assessment and Plan   1. HTN - at goal, continue current meds - did not tolerate norvasc after multiple tries, doing well on ARB and HCTZ.    2. Dizziness - no recent symptoms, continue to monitor   3. Hyperlipidemia - request labs from pcp   EKG today shows NSR  F/u 1 year   , M.D.,

## 2022-10-31 DIAGNOSIS — H2512 Age-related nuclear cataract, left eye: Secondary | ICD-10-CM | POA: Diagnosis not present

## 2022-11-01 DIAGNOSIS — E538 Deficiency of other specified B group vitamins: Secondary | ICD-10-CM | POA: Diagnosis not present

## 2022-11-04 DIAGNOSIS — H25812 Combined forms of age-related cataract, left eye: Secondary | ICD-10-CM | POA: Diagnosis not present

## 2022-11-04 DIAGNOSIS — H2512 Age-related nuclear cataract, left eye: Secondary | ICD-10-CM | POA: Diagnosis not present

## 2022-11-25 DIAGNOSIS — E538 Deficiency of other specified B group vitamins: Secondary | ICD-10-CM | POA: Diagnosis not present

## 2022-12-09 DIAGNOSIS — E782 Mixed hyperlipidemia: Secondary | ICD-10-CM | POA: Diagnosis not present

## 2022-12-09 DIAGNOSIS — E118 Type 2 diabetes mellitus with unspecified complications: Secondary | ICD-10-CM | POA: Diagnosis not present

## 2022-12-09 DIAGNOSIS — E559 Vitamin D deficiency, unspecified: Secondary | ICD-10-CM | POA: Diagnosis not present

## 2022-12-09 DIAGNOSIS — E538 Deficiency of other specified B group vitamins: Secondary | ICD-10-CM | POA: Diagnosis not present

## 2022-12-15 ENCOUNTER — Encounter: Payer: Self-pay | Admitting: Internal Medicine

## 2022-12-15 DIAGNOSIS — E538 Deficiency of other specified B group vitamins: Secondary | ICD-10-CM | POA: Diagnosis not present

## 2022-12-15 DIAGNOSIS — E782 Mixed hyperlipidemia: Secondary | ICD-10-CM | POA: Diagnosis not present

## 2022-12-15 DIAGNOSIS — E1169 Type 2 diabetes mellitus with other specified complication: Secondary | ICD-10-CM | POA: Diagnosis not present

## 2022-12-15 DIAGNOSIS — N3941 Urge incontinence: Secondary | ICD-10-CM | POA: Diagnosis not present

## 2022-12-15 DIAGNOSIS — E559 Vitamin D deficiency, unspecified: Secondary | ICD-10-CM | POA: Diagnosis not present

## 2022-12-15 DIAGNOSIS — R809 Proteinuria, unspecified: Secondary | ICD-10-CM | POA: Diagnosis not present

## 2022-12-15 DIAGNOSIS — I10 Essential (primary) hypertension: Secondary | ICD-10-CM | POA: Diagnosis not present

## 2022-12-15 DIAGNOSIS — M810 Age-related osteoporosis without current pathological fracture: Secondary | ICD-10-CM | POA: Diagnosis not present

## 2022-12-15 DIAGNOSIS — E118 Type 2 diabetes mellitus with unspecified complications: Secondary | ICD-10-CM | POA: Diagnosis not present

## 2022-12-23 DIAGNOSIS — E538 Deficiency of other specified B group vitamins: Secondary | ICD-10-CM | POA: Diagnosis not present

## 2023-01-19 DIAGNOSIS — E538 Deficiency of other specified B group vitamins: Secondary | ICD-10-CM | POA: Diagnosis not present

## 2023-02-14 DIAGNOSIS — Z0100 Encounter for examination of eyes and vision without abnormal findings: Secondary | ICD-10-CM | POA: Diagnosis not present

## 2023-02-14 DIAGNOSIS — H524 Presbyopia: Secondary | ICD-10-CM | POA: Diagnosis not present

## 2023-02-17 DIAGNOSIS — E538 Deficiency of other specified B group vitamins: Secondary | ICD-10-CM | POA: Diagnosis not present

## 2023-02-20 ENCOUNTER — Ambulatory Visit (HOSPITAL_COMMUNITY)
Admission: RE | Admit: 2023-02-20 | Discharge: 2023-02-20 | Disposition: A | Discharge status: Home / Self Care | Payer: Medicare HMO | Source: Ambulatory Visit | Attending: Family Medicine | Admitting: Family Medicine

## 2023-02-20 DIAGNOSIS — M810 Age-related osteoporosis without current pathological fracture: Secondary | ICD-10-CM | POA: Diagnosis not present

## 2023-02-20 DIAGNOSIS — Z780 Asymptomatic menopausal state: Secondary | ICD-10-CM | POA: Insufficient documentation

## 2023-02-20 DIAGNOSIS — Z13820 Encounter for screening for osteoporosis: Secondary | ICD-10-CM | POA: Insufficient documentation

## 2023-03-29 DIAGNOSIS — E538 Deficiency of other specified B group vitamins: Secondary | ICD-10-CM | POA: Diagnosis not present

## 2023-04-10 DIAGNOSIS — E118 Type 2 diabetes mellitus with unspecified complications: Secondary | ICD-10-CM | POA: Diagnosis not present

## 2023-04-10 DIAGNOSIS — E782 Mixed hyperlipidemia: Secondary | ICD-10-CM | POA: Diagnosis not present

## 2023-04-10 DIAGNOSIS — E538 Deficiency of other specified B group vitamins: Secondary | ICD-10-CM | POA: Diagnosis not present

## 2023-04-10 DIAGNOSIS — E559 Vitamin D deficiency, unspecified: Secondary | ICD-10-CM | POA: Diagnosis not present

## 2023-04-14 ENCOUNTER — Encounter: Payer: Self-pay | Admitting: Internal Medicine

## 2023-04-14 DIAGNOSIS — M810 Age-related osteoporosis without current pathological fracture: Secondary | ICD-10-CM | POA: Diagnosis not present

## 2023-04-14 DIAGNOSIS — E538 Deficiency of other specified B group vitamins: Secondary | ICD-10-CM | POA: Diagnosis not present

## 2023-04-14 DIAGNOSIS — E1169 Type 2 diabetes mellitus with other specified complication: Secondary | ICD-10-CM | POA: Diagnosis not present

## 2023-04-14 DIAGNOSIS — I10 Essential (primary) hypertension: Secondary | ICD-10-CM | POA: Diagnosis not present

## 2023-04-14 DIAGNOSIS — E118 Type 2 diabetes mellitus with unspecified complications: Secondary | ICD-10-CM | POA: Diagnosis not present

## 2023-04-14 DIAGNOSIS — E669 Obesity, unspecified: Secondary | ICD-10-CM | POA: Diagnosis not present

## 2023-04-14 DIAGNOSIS — N3941 Urge incontinence: Secondary | ICD-10-CM | POA: Diagnosis not present

## 2023-04-14 DIAGNOSIS — E559 Vitamin D deficiency, unspecified: Secondary | ICD-10-CM | POA: Diagnosis not present

## 2023-04-14 DIAGNOSIS — E782 Mixed hyperlipidemia: Secondary | ICD-10-CM | POA: Diagnosis not present

## 2023-04-24 ENCOUNTER — Telehealth: Payer: Self-pay | Admitting: Cardiology

## 2023-04-24 NOTE — Telephone Encounter (Signed)
Pt c/o Shortness Of Breath: STAT if SOB developed within the last 24 hours or pt is noticeably SOB on the phone  1. Are you currently SOB (can you hear that pt is SOB on the phone)? no  2. How long have you been experiencing SOB? Patient states for about two weeks.  She states yesterday she was very SOB, she couldn't sing at church yesterday.   3. Are you SOB when sitting or when up moving around? BOTH  4. Are you currently experiencing any other symptoms? She states not today.

## 2023-04-24 NOTE — Telephone Encounter (Signed)
Reports intermittent chest pain rated 4/10 and SOB that started 2 weeks ago. Denies active chest pain or SOB. Denies weight gain or swelling. Reports she has lost 15 lbs in the last 6 months. Reports planting tomatoes 2 weeks ago but denies any heavy lifting or vigorous movements that would have caused chest muscle pain. Gave 1st available appointment to see Tina Larson on 05/02/2023 @1 :30 pm. Advised to contact PCP for evaluation of non-cardiac causes of symptoms. Advised if she develops worsening symptoms, to go to the ED for an evaluation. Verbalized understanding of plan.

## 2023-04-25 ENCOUNTER — Ambulatory Visit: Payer: Medicare HMO | Attending: Nurse Practitioner | Admitting: Nurse Practitioner

## 2023-04-25 ENCOUNTER — Encounter: Payer: Self-pay | Admitting: Nurse Practitioner

## 2023-04-25 VITALS — BP 148/64 | HR 71 | Ht 63.5 in | Wt 171.8 lb

## 2023-04-25 DIAGNOSIS — R0609 Other forms of dyspnea: Secondary | ICD-10-CM | POA: Diagnosis not present

## 2023-04-25 DIAGNOSIS — Z79899 Other long term (current) drug therapy: Secondary | ICD-10-CM | POA: Diagnosis not present

## 2023-04-25 DIAGNOSIS — R0789 Other chest pain: Secondary | ICD-10-CM | POA: Diagnosis not present

## 2023-04-25 DIAGNOSIS — E785 Hyperlipidemia, unspecified: Secondary | ICD-10-CM

## 2023-04-25 DIAGNOSIS — I10 Essential (primary) hypertension: Secondary | ICD-10-CM

## 2023-04-25 MED ORDER — HYDROCHLOROTHIAZIDE 25 MG PO TABS: 25.0000 mg | ORAL_TABLET | Freq: Every day | ORAL | 3 refills | Status: DC

## 2023-04-25 NOTE — Patient Instructions (Addendum)
Medication Instructions:  Your physician has recommended you make the following change in your medication:   -Increase HCTZ to 25 mg tablet once daily.   *If you need a refill on your cardiac medications before your next appointment, please call your pharmacy*   Lab Work: In 1 week: -BMET  If you have labs (blood work) drawn today and your tests are completely normal, you will receive your results only by: MyChart Message (if you have MyChart) OR A paper copy in the mail If you have any lab test that is abnormal or we need to change your treatment, we will call you to review the results.   Testing/Procedures: Your physician has requested that you have an echocardiogram. Echocardiography is a painless test that uses sound waves to create images of your heart. It provides your doctor with information about the size and shape of your heart and how well your heart's chambers and valves are working. This procedure takes approximately one hour. There are no restrictions for this procedure. Please do NOT wear cologne, perfume, aftershave, or lotions (deodorant is allowed). Please arrive 15 minutes prior to your appointment time.    Follow-Up: At [facility], you and your health needs are our priority.  As part of our continuing mission to provide you with exceptional heart care, we have created designated Provider Care Teams.  These Care Teams include your primary Cardiologist (physician) and Advanced Practice Providers (APPs -  Physician Assistants and Nurse Practitioners) who all work together to provide you with the care you need, when you need it.  We recommend signing up for the patient portal called "MyChart".  Sign up information is provided on this After Visit Summary.  MyChart is used to connect with patients for Virtual Visits (Telemedicine).  Patients are able to view lab/test results, encounter notes, upcoming appointments, etc.  Non-urgent messages can be sent to your  provider as well.   To learn more about what you can do with MyChart, go to [email or web address].    Your next appointment:   6-8 week(s)  Provider:   Sharlene Dory, NP   Other Instructions

## 2023-04-25 NOTE — Progress Notes (Signed)
Office Visit    Patient Name: Tina Larson Date of Encounter: 04/25/2023  PCP:  Benita Stabile, MD   [facility]  Cardiologist:  Dina Rich, MD  Advanced Practice Provider:  No care team member to display Electrophysiologist:  None   Chief Complaint    Tina Larson is a 82 y.o. female with a hx of hypertension, hyperlipidemia, dizziness/vertigo, who presents today for chest pain and shortness of breath evaluation.  Past Medical History    Past Medical History:  Diagnosis Date   Hyperlipidemia    Hypertension    Past Surgical History:  Procedure Laterality Date   APPENDECTOMY      Allergies  Allergies  Allergen Reactions   Shellfish Allergy    Norvasc [Amlodipine] Other (See Comments)    fatigue   Tramadol Itching    Patient states that it makes her dizzy and she feels as if she can't walk when she takes this medication so she would rather not take it. INTOLERANCE     History of Present Illness    AOIBHEANN Larson is a 82 y.o. female with a PMH as mentioned above.  Last seen by Dr. Dina Rich on 10/11/2022.  Was doing well at that time.  Blood pressure was at goal.  EKG revealed normal sinus rhythm.  She contacted our office yesterday noting intermittent chest pain and shortness of breath that started 2 weeks ago.  Noted also losing 15 pounds in the last 6 months.   Today she presents to our office for evaluation with her husband.  She states she had an episode of chest pressure about 2 to 3 weeks ago, left-sided anterior and took 2 baby aspirin, lay down to sleep, woke up and felt much better, pain was relieved.  Denies any recurrent symptoms and chest pressure.  Chief concern today is shortness of breath with exertion, states she feels like " I am not able to get a full breath in."  Said she was short of breath on Sunday.  Does admit to several stressors currently.  Son has a form of Parkinson's disease and recently having to move  their trailer. Admits to poor sleep. Denies any palpitations, syncope, presyncope, dizziness, orthopnea, PND, swelling or significant weight changes, acute bleeding, or claudication.  EKGs/Labs/Other Studies Reviewed:   The following studies were reviewed today:   EKG:  EKG is not ordered today.   Recent Labs: No results found for requested labs within last 365 days.  Recent Lipid Panel No results found for: "CHOL", "TRIG", "HDL", "CHOLHDL", "VLDL", "LDLCALC", "LDLDIRECT"   Home Medications   Current Meds  Medication Sig   alendronate (FOSAMAX) 70 MG tablet Take 70 mg by mouth once a week. Sunday am   Cholecalciferol (VITAMIN D3) 50 MCG (2000 UT) TABS Take 1 tablet by mouth every morning.   cyanocobalamin (,VITAMIN B-12,) 1000 MCG/ML injection SMARTSIG:1 Milliliter(s) Topical Once a Month   hydrochlorothiazide (HYDRODIURIL) 25 MG tablet Take 1 tablet (25 mg total) by mouth daily.   meclizine (ANTIVERT) 25 MG tablet Take 25 mg by mouth 3 (three) times daily as needed for dizziness.   Multiple Vitamin (MULTIVITAMIN) tablet Take 1 tablet by mouth every morning.   olmesartan (BENICAR) 40 MG tablet Take 40 mg by mouth every morning.   pravastatin (PRAVACHOL) 40 MG tablet Take 40 mg by mouth every evening.   Vitamin D, Ergocalciferol, (DRISDOL) 1.25 MG (50000 UNIT) CAPS capsule Take 50,000 Units by mouth once a  week. Monday am   hydrochlorothiazide (HYDRODIURIL) 12.5 MG tablet Take 12.5 mg by mouth daily.     Review of Systems    All other systems reviewed and are otherwise negative except as noted above.  Physical Exam    VS:  BP (!) 148/64   Pulse 71   Ht 5' 3.5" (1.613 m)   Wt 171 lb 12.8 oz (77.9 kg)   SpO2 99%   BMI 29.96 kg/m  , BMI Body mass index is 29.96 kg/m.  Wt Readings from Last 3 Encounters:  04/25/23 171 lb 12.8 oz (77.9 kg)  10/11/22 174 lb 6.4 oz (79.1 kg)  05/10/22 182 lb 3.2 oz (82.6 kg)    Repeat blood pressure left arm: 158/70 Repeat blood pressure  right arm: 161/80  GEN: Well nourished, well developed, in no acute distress. HEENT: normal. Neck: Supple, no JVD, carotid bruits, or masses. Cardiac: S1/S2, RRR, no murmurs, rubs, or gallops. No clubbing, cyanosis, edema.  Radials/PT 2+ and equal bilaterally.  Respiratory:  Respirations regular and unlabored, clear to auscultation bilaterally. MS: No deformity or atrophy. Skin: Warm and dry, no rash. Neuro:  Strength and sensation are intact. Psych: Normal affect.  Assessment & Plan    Chest pressure, DOE 1 time limited episode of chest pressure, denies any recurrence in symptoms.  Chief concern today is dyspnea on exertion.  Stress seems to be contributing to symptoms, has several current stressors in her life, admits to poor sleep.  She has not had stress testing performed.  Educated her on stress relief strategies and the importance of good sleep hygiene.  She verbalized understanding. Given one-time limited episode, no indication for ischemic evaluation at this time.  Will obtain echocardiogram for further evaluation for DOE. Heart healthy diet encouraged.  ED precautions discussed.  If no improvement in chest pressure by next follow-up, consider/discuss nuclear stress testing.  HTN, medication management Blood pressure elevated today and not at goal.  Will increase hydrochlorothiazide to 25 mg daily.  Continue olmesartan. Will repeat BMET in 1 week.  Given BP log and salty six diet sheet. Discussed to monitor BP at home at least 2 hours after medications and sitting for 5-10 minutes. Heart healthy diet encouraged.   HLD Recent LDL at goal.  Continue pravastatin. Heart healthy diet encouraged.    Disposition: Follow up in 6-8 week(s) with Dina Rich, MD or APP.  Signed, Sharlene Dory, NP 04/25/2023, 2:22 PM [facility]

## 2023-04-27 DIAGNOSIS — E538 Deficiency of other specified B group vitamins: Secondary | ICD-10-CM | POA: Diagnosis not present

## 2023-05-02 ENCOUNTER — Ambulatory Visit: Payer: Medicare HMO | Admitting: Nurse Practitioner

## 2023-05-26 DIAGNOSIS — E538 Deficiency of other specified B group vitamins: Secondary | ICD-10-CM | POA: Diagnosis not present

## 2023-05-26 DIAGNOSIS — M810 Age-related osteoporosis without current pathological fracture: Secondary | ICD-10-CM | POA: Diagnosis not present

## 2023-06-12 ENCOUNTER — Ambulatory Visit (HOSPITAL_COMMUNITY)
Admission: RE | Admit: 2023-06-12 | Discharge: 2023-06-12 | Disposition: A | Discharge status: Home / Self Care | Payer: Medicare HMO | Source: Ambulatory Visit | Attending: Nurse Practitioner | Admitting: Nurse Practitioner

## 2023-06-12 DIAGNOSIS — R0609 Other forms of dyspnea: Secondary | ICD-10-CM | POA: Diagnosis not present

## 2023-06-12 LAB — ECHOCARDIOGRAM COMPLETE
Area-P 1/2: 2.48 cm2
Calc EF: 71.2 %
MV M vel: 2.13 m/s
MV Peak grad: 18.1 mmHg
MV VTI: 1.64 cm2
S' Lateral: 2.5 cm
Single Plane A2C EF: 71.9 %
Single Plane A4C EF: 70.1 %

## 2023-06-12 NOTE — Progress Notes (Signed)
  Echocardiogram 2D Echocardiogram has been performed.  [name] 06/12/2023, 3:44 PM

## 2023-06-13 ENCOUNTER — Ambulatory Visit: Payer: Medicare HMO | Attending: Nurse Practitioner | Admitting: Nurse Practitioner

## 2023-06-13 ENCOUNTER — Encounter: Payer: Self-pay | Admitting: Nurse Practitioner

## 2023-06-13 VITALS — BP 141/81 | HR 76 | Ht 63.0 in | Wt 171.0 lb

## 2023-06-13 DIAGNOSIS — E785 Hyperlipidemia, unspecified: Secondary | ICD-10-CM

## 2023-06-13 DIAGNOSIS — I10 Essential (primary) hypertension: Secondary | ICD-10-CM

## 2023-06-13 DIAGNOSIS — R5383 Other fatigue: Secondary | ICD-10-CM

## 2023-06-13 DIAGNOSIS — R0789 Other chest pain: Secondary | ICD-10-CM | POA: Diagnosis not present

## 2023-06-13 NOTE — Progress Notes (Unsigned)
  Cardiology Office Note:  .   Date:  06/13/2023  ID:  Tina Larson, DOB [id number], MRN [id number] PCP: Benita Stabile, MD   HeartCare Providers Cardiologist:  Dina Rich, MD { Click to update primary MD,subspecialty MD or APP then REFRESH:1}   History of Present Illness: .   Tina Larson is a 82 y.o. female with a PMH of chest pressure, HTN, HLD, dizziness, vertigo, who presents today for follow-up.   Last saw patient on Apr 25, 2023. Had an episode of chest pressure that was noted but CC was DOE. Admitted to poor sleep and stress.   ROS: ***  Studies Reviewed: .        *** Risk Assessment/Calculations:   {Does this patient have ATRIAL FIBRILLATION?[phone]} The patient's 1st BP is elevated (>139/89)*** Repeat BP and {Click to enter a 2nd BP Refresh Note  :1}       Physical Exam:   VS:  BP (!) 140/70   Pulse 76   Ht 5\' 3"  (1.6 m)   Wt 171 lb (77.6 kg)   SpO2 97%   BMI 30.29 kg/m    Wt Readings from Last 3 Encounters:  06/13/23 171 lb (77.6 kg)  04/25/23 171 lb 12.8 oz (77.9 kg)  10/11/22 174 lb 6.4 oz (79.1 kg)    GEN: Well nourished, well developed in no acute distress NECK: No JVD; No carotid bruits CARDIAC: ***RRR, no murmurs, rubs, gallops RESPIRATORY:  Clear to auscultation without rales, wheezing or rhonchi  ABDOMEN: Soft, non-tender, non-distended EXTREMITIES:  No edema; No deformity   ASSESSMENT AND PLAN: .   ***    {Are you ordering a CV Procedure (e.g. stress test, cath, DCCV, TEE, etc)?   Press F2        :[phone]}  Dispo: ***  Signed, Sharlene Dory, NP

## 2023-06-13 NOTE — Patient Instructions (Addendum)
Medication Instructions:  Your physician recommends that you continue on your current medications as directed. Please refer to the Current Medication list given to you today.  Labwork: none  Testing/Procedures: none  Follow-Up: Your physician recommends that you schedule a follow-up appointment in: Follow up with JB as scheduled  Any Other Special Instructions Will Be Listed Below (If Applicable).  If you need a refill on your cardiac medications before your next appointment, please call your pharmacy.

## 2023-06-22 DIAGNOSIS — E538 Deficiency of other specified B group vitamins: Secondary | ICD-10-CM | POA: Diagnosis not present

## 2023-07-14 ENCOUNTER — Other Ambulatory Visit (HOSPITAL_COMMUNITY): Payer: Self-pay | Admitting: Internal Medicine

## 2023-07-14 DIAGNOSIS — Z1231 Encounter for screening mammogram for malignant neoplasm of breast: Secondary | ICD-10-CM

## 2023-07-14 DIAGNOSIS — E538 Deficiency of other specified B group vitamins: Secondary | ICD-10-CM | POA: Diagnosis not present

## 2023-07-24 ENCOUNTER — Encounter (HOSPITAL_COMMUNITY): Payer: Self-pay

## 2023-07-24 ENCOUNTER — Ambulatory Visit (HOSPITAL_COMMUNITY)
Admission: RE | Admit: 2023-07-24 | Discharge: 2023-07-24 | Disposition: A | Discharge status: Home / Self Care | Payer: Medicare HMO | Source: Ambulatory Visit | Attending: Internal Medicine | Admitting: Internal Medicine

## 2023-07-24 DIAGNOSIS — Z1231 Encounter for screening mammogram for malignant neoplasm of breast: Secondary | ICD-10-CM | POA: Diagnosis not present

## 2023-08-14 DIAGNOSIS — E538 Deficiency of other specified B group vitamins: Secondary | ICD-10-CM | POA: Diagnosis not present

## 2023-08-14 DIAGNOSIS — E118 Type 2 diabetes mellitus with unspecified complications: Secondary | ICD-10-CM | POA: Diagnosis not present

## 2023-08-14 DIAGNOSIS — E782 Mixed hyperlipidemia: Secondary | ICD-10-CM | POA: Diagnosis not present

## 2023-08-14 DIAGNOSIS — E559 Vitamin D deficiency, unspecified: Secondary | ICD-10-CM | POA: Diagnosis not present

## 2023-08-18 DIAGNOSIS — E118 Type 2 diabetes mellitus with unspecified complications: Secondary | ICD-10-CM | POA: Diagnosis not present

## 2023-08-18 DIAGNOSIS — Z0000 Encounter for general adult medical examination without abnormal findings: Secondary | ICD-10-CM | POA: Diagnosis not present

## 2023-08-18 DIAGNOSIS — E782 Mixed hyperlipidemia: Secondary | ICD-10-CM | POA: Diagnosis not present

## 2023-08-18 DIAGNOSIS — E1159 Type 2 diabetes mellitus with other circulatory complications: Secondary | ICD-10-CM | POA: Diagnosis not present

## 2023-08-18 DIAGNOSIS — I10 Essential (primary) hypertension: Secondary | ICD-10-CM | POA: Diagnosis not present

## 2023-08-18 DIAGNOSIS — E1165 Type 2 diabetes mellitus with hyperglycemia: Secondary | ICD-10-CM | POA: Diagnosis not present

## 2023-08-18 DIAGNOSIS — E538 Deficiency of other specified B group vitamins: Secondary | ICD-10-CM | POA: Diagnosis not present

## 2023-08-18 DIAGNOSIS — E1169 Type 2 diabetes mellitus with other specified complication: Secondary | ICD-10-CM | POA: Diagnosis not present

## 2023-08-18 DIAGNOSIS — Z23 Encounter for immunization: Secondary | ICD-10-CM | POA: Diagnosis not present

## 2023-08-21 ENCOUNTER — Encounter: Payer: Self-pay | Admitting: Internal Medicine

## 2023-09-08 DIAGNOSIS — E538 Deficiency of other specified B group vitamins: Secondary | ICD-10-CM | POA: Diagnosis not present

## 2023-10-23 ENCOUNTER — Encounter: Payer: Self-pay | Admitting: Cardiology

## 2023-10-23 ENCOUNTER — Ambulatory Visit: Payer: Medicare HMO | Attending: Cardiology | Admitting: Cardiology

## 2023-10-23 VITALS — BP 144/65 | HR 72 | Ht 63.0 in | Wt 171.2 lb

## 2023-10-23 DIAGNOSIS — Z79899 Other long term (current) drug therapy: Secondary | ICD-10-CM | POA: Diagnosis not present

## 2023-10-23 DIAGNOSIS — R0789 Other chest pain: Secondary | ICD-10-CM

## 2023-10-23 DIAGNOSIS — I10 Essential (primary) hypertension: Secondary | ICD-10-CM

## 2023-10-23 DIAGNOSIS — E782 Mixed hyperlipidemia: Secondary | ICD-10-CM

## 2023-10-23 DIAGNOSIS — E538 Deficiency of other specified B group vitamins: Secondary | ICD-10-CM | POA: Diagnosis not present

## 2023-10-23 MED ORDER — SPIRONOLACTONE 25 MG PO TABS: 12.5000 mg | ORAL_TABLET | Freq: Every day | ORAL | 6 refills | Status: DC

## 2023-10-23 NOTE — Progress Notes (Signed)
Clinical Summary Ms. Halleck is a 82 y.o.female seen today for follow up of the following medical problems.    1. Dizziness/Vertigo   - episode 10/14 while inside while standing on scale. Immeidately with looking down her vision got altered, "swirling white changes". Felt off balance, treid to sit down and fell to floor. Tried to get up but fell back down again. Felt swimmy headed.  - got onto bed, room was spinning, worst with head movement.    - seen in [facility] for dizziness and fall. Treated for vertigo. From pcp notes +dix hall pike maneuvers. Orthostatics negative at pcp office - referred to PT for vertigo exercises   - reports no significant symptoms of dizziness recently.  - has meclizine as needed, has not needed to use  - no recent symptoms   2. HTN  -norvasc even at low doses caused dizziness, fatigue.     - home bp's 2-3 time a week. 130s-140s/70s - compliant with meds - frequent urniation on higher dose of hydrochlorothiazide   3. Chest pain - seen by PA Philis Nettle 06/2023 for MSK chest pain. Was positional, tender to palpation - 06/2023 echo: LVEF 65-70%, grade I dd, normal RV function  - left sided pain can occur at rest or with exertion. Pressure like feeling, 5/10 in severity. Can better with deep breaths. Lasts about 5-28min. 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 INTOLERANCE      Current Outpatient Medications  Medication Sig Dispense Refill   Cholecalciferol (VITAMIN D3) 50 MCG (2000  UT) TABS Take 1 tablet by mouth every morning.     cyanocobalamin (,VITAMIN B-12,) 1000 MCG/ML injection SMARTSIG:1 Milliliter(s) Topical Once a Month     hydrochlorothiazide (HYDRODIURIL) 25 MG tablet Take 1 tablet (25 mg total) by mouth daily. (Patient taking differently: Take 12.5 mg by mouth daily.) 90 tablet 3   Multiple Vitamin (MULTIVITAMIN) tablet Take 1 tablet by mouth every morning.     olmesartan (BENICAR) 40 MG tablet Take 40 mg by mouth every morning.     pravastatin (PRAVACHOL) 40 MG tablet Take 40 mg by mouth every evening.     PROLIA 60 MG/ML SOSY injection Inject 60 mg into the skin every 6 (six) months.     Vitamin D, Ergocalciferol, (DRISDOL) 1.25 MG (50000 UNIT) CAPS capsule Take 50,000 Units by mouth once a week. Monday am     alendronate (FOSAMAX) 70 MG tablet Take 70 mg by mouth once a week. Sunday am (Patient not taking: Reported on 10/23/2023)     meclizine (ANTIVERT) 25 MG tablet Take 25 mg by mouth 3 (three) times daily as needed for dizziness. (Patient not taking: Reported on 06/13/2023)     No current facility-administered medications for this visit.     Past Surgical History:  Procedure Laterality Date   APPENDECTOMY       Allergies  Allergen Reactions   Shellfish Allergy    Norvasc [Amlodipine] Other (See Comments)    fatigue   Tramadol Itching    Patient states that it makes her dizzy and she feels as if she can't walk when she takes this medication so she would rather not take it. INTOLERANCE       Family History  Problem Relation Age of Onset   Cancer Mother        Colon   Heart attack Father    Cancer Father        Lung     Social History Ms. Tirabassi reports that she has never smoked. She has never been exposed to tobacco smoke. She has never used smokeless tobacco. Ms. Beyer reports no history of alcohol use.   Review of Systems CONSTITUTIONAL: No weight loss, fever, chills, weakness or fatigue.  HEENT: Eyes: No visual loss, blurred  vision, double vision or yellow sclerae.No hearing loss, sneezing, congestion, runny nose or sore throat.  SKIN: No rash or itching.  CARDIOVASCULAR: per hpi RESPIRATORY: No shortness of breath, cough or sputum.  GASTROINTESTINAL: No anorexia, nausea, vomiting or diarrhea. No abdominal pain or blood.  GENITOURINARY: No burning on urination, no polyuria NEUROLOGICAL: No headache, dizziness, syncope, paralysis, ataxia, numbness or tingling in the extremities. No change in bowel or bladder control.  MUSCULOSKELETAL: No muscle, back pain, joint pain or stiffness.  LYMPHATICS: No enlarged nodes. No history of splenectomy.  PSYCHIATRIC: No history of depression or anxiety.  ENDOCRINOLOGIC: No reports of sweating, cold or heat intolerance. No polyuria or polydipsia.  Marland Kitchen   Physical Examination Today's Vitals   10/23/23 1431 10/23/23 1459  BP: 136/68 (!) 144/65  Pulse: 72   SpO2: 97%   Weight: 171 lb 3.2 oz (77.7 kg)   Height: 5\' 3"  (1.6 m)    Body mass index is 30.33 kg/m.  Gen: resting comfortably, no acute distress HEENT: no scleral icterus, pupils equal round and reactive, no palptable cervical adenopathy,  CV: RRR, no m/rg no jvd Resp: Clear to auscultation bilaterally GI: abdomen is soft, non-tender, non-distended, normal bowel sounds, no hepatosplenomegaly MSK: extremities are warm, no edema.  Skin: warm, no rash Neuro:  no focal deficits Psych: appropriate affect   Diagnostic Studies  06/2023 echo 1. Left ventricular ejection fraction, by estimation, is 65 to 70%. The  left ventricle has normal function. The left ventricle has no regional  wall motion abnormalities. There is mild concentric left ventricular  hypertrophy. Left ventricular diastolic  parameters are consistent with Grade I diastolic dysfunction (impaired  relaxation).   2. Right ventricular systolic function is normal. The right ventricular  size is normal. There is normal pulmonary artery systolic pressure.  The  estimated right ventricular systolic pressure is 29.7 mmHg.   3. The mitral valve is grossly normal. Trivial mitral valve  regurgitation.   4. The aortic valve is tricuspid. Aortic valve regurgitation is not  visualized.   5. The inferior vena cava is dilated in size with >50% respiratory  variability, suggesting right atrial pressure of 8 mmHg.      Assessment and Plan   1. HTN - did not tolerate norvasc after multiple tries - reports frequent urination on higher hydrochlorothiazide dozing - on highest dose of benicar. Will add aldactone 12.5mg  daily,. Check bmet 2 weeks.    2. HLD - at goal, continue current meds  3. Nocardiac chest pain - nonexertional, positional component, tender to palpation - recent echo was benign.  -  no further cardiac testing indicated.    EKG today shows SR, rare PVCs    Antoine Poche, M.D.

## 2023-10-23 NOTE — Patient Instructions (Addendum)
Medication Instructions:   Begin Spironolactone 12.5mg  daily  Continue all other medications.     Labwork:  BMET - order given  Please do in 2 weeks  Office will contact with results via phone, letter or mychart.     Testing/Procedures:  none  Follow-Up:  6 months   Any Other Special Instructions Will Be Listed Below (If Applicable).  Please update the office in 2 weeks on your BP readings.   If you need a refill on your cardiac medications before your next appointment, please call your pharmacy. '

## 2023-10-24 ENCOUNTER — Telehealth: Payer: Self-pay | Admitting: Cardiology

## 2023-10-24 MED ORDER — SPIRONOLACTONE 25 MG PO TABS: 12.5000 mg | ORAL_TABLET | Freq: Every day | ORAL | 6 refills | Status: DC

## 2023-10-24 NOTE — Telephone Encounter (Signed)
*  STAT* If patient is at the pharmacy, call can be transferred to refill team.   1. Which medications need to be refilled? (please list name of each medication and dose if known)  spironolactone (ALDACTONE) 25 MG tablet  2. Which pharmacy/location (including street and city if local pharmacy) is medication to be sent to?* [facility] - [place], [place] - [address]   3. Do they need a 30 day or 90 day supply?   30 day supply

## 2023-10-24 NOTE — Telephone Encounter (Signed)
Medication resent to [facility] per pt's request.

## 2023-10-30 ENCOUNTER — Ambulatory Visit: Payer: Medicare HMO | Attending: Cardiology | Admitting: *Deleted

## 2023-10-30 VITALS — BP 120/54 | HR 70 | Ht 63.0 in | Wt 169.0 lb

## 2023-10-30 DIAGNOSIS — I10 Essential (primary) hypertension: Secondary | ICD-10-CM

## 2023-10-30 NOTE — Patient Instructions (Signed)
Your physician recommends that you continue on your current medications as directed. Please refer to the Current Medication list given to you today.

## 2023-10-30 NOTE — Progress Notes (Signed)
Walked into office requesting requesting to have her home BP monitor checked for accuracy. Reports she has been getting lower BP readings at home. Reports SBP has been around 110-123 and she felt this was too low. Denies SOB, dizziness or chest pain.  Medications reviewed. Vitals done and routed to provider for review.   Office BP manual 120/54 & HR 70 Home BP monitor 108/47 & HR 71

## 2023-10-31 ENCOUNTER — Telehealth: Payer: Self-pay | Admitting: *Deleted

## 2023-10-31 NOTE — Telephone Encounter (Signed)
Husband Greggory Stallion informed and verbalized understanding.

## 2023-10-31 NOTE — Telephone Encounter (Addendum)
  Home bp is lower than ours. Consider getting new cuff. In general would not consider bp being too low unless her systolic was in the 90s.   Dominga Ferry MD

## 2023-10-31 NOTE — Progress Notes (Signed)
Home bp is lower than ours. Consider getting new cuff. In general would not consider bp being too low unless her systolic was in the 90s.  Dominga Ferry MD

## 2023-11-06 ENCOUNTER — Other Ambulatory Visit (HOSPITAL_COMMUNITY)
Admission: RE | Admit: 2023-11-06 | Discharge: 2023-11-06 | Disposition: A | Discharge status: Home / Self Care | Payer: Medicare HMO | Source: Ambulatory Visit | Attending: Cardiology | Admitting: Cardiology

## 2023-11-06 DIAGNOSIS — E782 Mixed hyperlipidemia: Secondary | ICD-10-CM | POA: Diagnosis not present

## 2023-11-06 DIAGNOSIS — Z79899 Other long term (current) drug therapy: Secondary | ICD-10-CM | POA: Diagnosis not present

## 2023-11-06 LAB — BASIC METABOLIC PANEL
Anion gap: 9 (ref 5–15)
BUN: 18 mg/dL (ref 8–23)
CO2: 28 mmol/L (ref 22–32)
Calcium: 10 mg/dL (ref 8.9–10.3)
Chloride: 103 mmol/L (ref 98–111)
Creatinine, Ser: 0.9 mg/dL (ref 0.44–1.00)
GFR, Estimated: >60 mL/min (ref >60)
Glucose, Bld: 124 mg/dL — ABNORMAL HIGH (ref 70–99)
Potassium: 3.8 mmol/L (ref 3.5–5.1)
Sodium: 140 mmol/L (ref 135–145)

## 2023-11-23 ENCOUNTER — Telehealth: Payer: Self-pay

## 2023-11-23 ENCOUNTER — Other Ambulatory Visit: Payer: Self-pay

## 2023-11-23 DIAGNOSIS — M810 Age-related osteoporosis without current pathological fracture: Secondary | ICD-10-CM | POA: Insufficient documentation

## 2023-11-23 DIAGNOSIS — E538 Deficiency of other specified B group vitamins: Secondary | ICD-10-CM | POA: Diagnosis not present

## 2023-11-23 NOTE — Telephone Encounter (Addendum)
Auth Submission: APPROVED Site of care: Site of care: [facility] Payer: [employer] Medication & CPT/J Code(s) submitted: Prolia (Denosumab) [id number] Route of submission (phone, fax, portal): portal Phone # Fax # Auth type: Buy/Bill PB Units/visits requested: 60mg , q68months Reference number: [id number] Approval from: 04/17/23 to 12/04/24

## 2023-11-27 ENCOUNTER — Telehealth: Payer: Self-pay | Admitting: *Deleted

## 2023-11-27 NOTE — Telephone Encounter (Signed)
Notified, copy to pcp.

## 2023-11-27 NOTE — Telephone Encounter (Signed)
-----   Message from Dina Rich sent at 11/26/2023  7:41 AM EST ----- Normal labs  Dominga Ferry MD

## 2023-11-30 DIAGNOSIS — E538 Deficiency of other specified B group vitamins: Secondary | ICD-10-CM | POA: Diagnosis not present

## 2023-11-30 DIAGNOSIS — E118 Type 2 diabetes mellitus with unspecified complications: Secondary | ICD-10-CM | POA: Diagnosis not present

## 2023-11-30 DIAGNOSIS — E559 Vitamin D deficiency, unspecified: Secondary | ICD-10-CM | POA: Diagnosis not present

## 2023-11-30 DIAGNOSIS — E782 Mixed hyperlipidemia: Secondary | ICD-10-CM | POA: Diagnosis not present

## 2023-12-05 ENCOUNTER — Encounter: Payer: Medicare HMO | Attending: Internal Medicine | Admitting: Internal Medicine

## 2023-12-05 VITALS — BP 182/65 | HR 69 | Temp 97.6°F | Resp 16

## 2023-12-05 DIAGNOSIS — R42 Dizziness and giddiness: Secondary | ICD-10-CM | POA: Diagnosis not present

## 2023-12-05 DIAGNOSIS — I10 Essential (primary) hypertension: Secondary | ICD-10-CM | POA: Diagnosis not present

## 2023-12-05 DIAGNOSIS — E559 Vitamin D deficiency, unspecified: Secondary | ICD-10-CM | POA: Diagnosis not present

## 2023-12-05 DIAGNOSIS — E782 Mixed hyperlipidemia: Secondary | ICD-10-CM | POA: Diagnosis not present

## 2023-12-05 DIAGNOSIS — M810 Age-related osteoporosis without current pathological fracture: Secondary | ICD-10-CM | POA: Diagnosis not present

## 2023-12-05 DIAGNOSIS — N3941 Urge incontinence: Secondary | ICD-10-CM | POA: Diagnosis not present

## 2023-12-05 DIAGNOSIS — E538 Deficiency of other specified B group vitamins: Secondary | ICD-10-CM | POA: Diagnosis not present

## 2023-12-05 DIAGNOSIS — E118 Type 2 diabetes mellitus with unspecified complications: Secondary | ICD-10-CM | POA: Diagnosis not present

## 2023-12-05 DIAGNOSIS — E1165 Type 2 diabetes mellitus with hyperglycemia: Secondary | ICD-10-CM | POA: Diagnosis not present

## 2023-12-05 MED ORDER — DENOSUMAB 60 MG/ML ~~LOC~~ SOSY
60.0000 mg | PREFILLED_SYRINGE | Freq: Once | SUBCUTANEOUS | Status: AC
  Administered 2023-12-05: 60 mg via SUBCUTANEOUS

## 2023-12-05 NOTE — Progress Notes (Signed)
 Diagnosis: Osteoporosis  Provider:  Dwana Melena MD  Procedure: Injection  Prolia (Denosumab), Dose: 60 mg, Site: subcutaneous, Number of injections: 1  Injection Site(s): Right arm  Post Care: Observation period completed  Discharge: Condition: Good, Destination: Home . AVS Provided  Performed by:  Cleotilde Neer, LPN

## 2023-12-18 DIAGNOSIS — E538 Deficiency of other specified B group vitamins: Secondary | ICD-10-CM | POA: Diagnosis not present

## 2023-12-20 NOTE — Progress Notes (Signed)
  Intake history:  There were no vitals taken for this visit. There is no height or weight on file to calculate BMI.    WHAT ARE WE SEEING YOU FOR TODAY?  Left  shoulder  How long has this bothered you?   11/29/23 fell   Anticoag.  No  Diabetes No  Heart disease No  Hypertension No  SMOKING HX No  Kidney disease No  Any ALLERGIES ______________________________________________   Treatment:  Have you taken:  Tylenol Yes  Advil No  Had PT No  Had injection No  Other  _________________________

## 2023-12-22 ENCOUNTER — Other Ambulatory Visit (INDEPENDENT_AMBULATORY_CARE_PROVIDER_SITE_OTHER): Payer: Self-pay

## 2023-12-22 ENCOUNTER — Encounter: Payer: Self-pay | Admitting: Orthopedic Surgery

## 2023-12-22 ENCOUNTER — Ambulatory Visit: Payer: Medicare HMO | Admitting: Orthopedic Surgery

## 2023-12-22 DIAGNOSIS — G8929 Other chronic pain: Secondary | ICD-10-CM

## 2023-12-22 DIAGNOSIS — M7542 Impingement syndrome of left shoulder: Secondary | ICD-10-CM

## 2023-12-22 DIAGNOSIS — M25512 Pain in left shoulder: Secondary | ICD-10-CM

## 2023-12-22 NOTE — Progress Notes (Signed)
Subjective:     Patient ID: Tina Larson, female   DOB: [birth year], 83 y.o.   MRN: [id number]   Intake history:  There were no vitals taken for this visit. There is no height or weight on file to calculate BMI.    WHAT ARE WE SEEING YOU FOR TODAY?  Left  shoulder  How long has this bothered you?   11/29/23 fell   Anticoag.  No  Diabetes No  Heart disease No  Hypertension No  SMOKING HX No  Kidney disease No  Any ALLERGIES ______________________________________________   Treatment:  Have you taken:  Tylenol Yes  Advil No  Had PT No  Had injection No  Other  _________________________   83 year old female presents with shoulder pain started after she fell taking care of her disabled son.  They both fell and she had increased pain in the left shoulder worse with abduction and increased pain at night.  She saw her primary care doctor and he recommended she see [place] in follow-up  Shoulder Injury       Objective:   Physical Exam Vitals and nursing note reviewed.  Constitutional:      Appearance: Normal appearance.  HENT:     Head: Normocephalic and atraumatic.  Eyes:     General: No scleral icterus.       Right eye: No discharge.        Left eye: No discharge.     Extraocular Movements: Extraocular movements intact.     Conjunctiva/sclera: Conjunctivae normal.     Pupils: Pupils are equal, round, and reactive to light.  Cardiovascular:     Rate and Rhythm: Normal rate.     Pulses: Normal pulses.  Musculoskeletal:     Comments: Left shoulder tender in the rotator interval with painful abduction at 90 degrees painful forward elevation at 110 degrees and positive impingement at 150 degrees but no weakness in the rotator cuff just pain against resistance  Internal/external rotation when the arm is at the side is normal  Skin:    General: Skin is warm and dry.     Capillary Refill: Capillary refill takes less than 2 seconds.  Neurological:     General:  No focal deficit present.     Mental Status: She is alert and oriented to person, place, and time.  Psychiatric:        Mood and Affect: Mood normal.        Behavior: Behavior normal.        Thought Content: Thought content normal.        Judgment: Judgment normal.        Assessment:     DG Shoulder Left Result Date: 12/22/2023 Imaging left shoulder Anterolateral left shoulder pain increased pain at night AP lateral Type I acromion Normal glenohumeral joint Head No lesions in the chest wall Mild changes seen in the cervical spine Impression normal left shoulder  Encounter Diagnoses  Name Primary?   Acute pain of left shoulder Yes   Impingement syndrome of left shoulder    She seems to have a impingement syndrome without cuff tear    Plan:     Subacromial injection  Procedure note the subacromial injection shoulder left   Verbal consent was obtained to inject the  Left   Shoulder  Timeout was completed to confirm the injection site is a subacromial space of the  left  shoulder  Medication used Depo-Medrol 40 mg and lidocaine 1% 3 cc  Anesthesia was  provided by ethyl chloride  The injection was performed in the left  posterior subacromial space. After pinning the skin with alcohol and anesthetized the skin with ethyl chloride the subacromial space was injected using a 20-gauge needle. There were no complications  Sterile dressing was applied.

## 2024-01-12 DIAGNOSIS — E538 Deficiency of other specified B group vitamins: Secondary | ICD-10-CM | POA: Diagnosis not present

## 2024-01-18 DIAGNOSIS — G4730 Sleep apnea, unspecified: Secondary | ICD-10-CM | POA: Diagnosis not present

## 2024-02-01 ENCOUNTER — Other Ambulatory Visit (HOSPITAL_COMMUNITY): Payer: Self-pay | Admitting: Nurse Practitioner

## 2024-02-01 ENCOUNTER — Encounter (HOSPITAL_COMMUNITY): Payer: Self-pay | Admitting: Nurse Practitioner

## 2024-02-01 DIAGNOSIS — R0781 Pleurodynia: Secondary | ICD-10-CM | POA: Diagnosis not present

## 2024-02-01 DIAGNOSIS — W19XXXA Unspecified fall, initial encounter: Secondary | ICD-10-CM | POA: Diagnosis not present

## 2024-02-02 ENCOUNTER — Ambulatory Visit (HOSPITAL_COMMUNITY)
Admission: RE | Admit: 2024-02-02 | Discharge: 2024-02-02 | Disposition: A | Discharge status: Home / Self Care | Payer: Medicare HMO | Source: Ambulatory Visit | Attending: Nurse Practitioner | Admitting: Nurse Practitioner

## 2024-02-02 ENCOUNTER — Other Ambulatory Visit (HOSPITAL_COMMUNITY): Payer: Self-pay | Admitting: Nurse Practitioner

## 2024-02-02 DIAGNOSIS — S2232XA Fracture of one rib, left side, initial encounter for closed fracture: Secondary | ICD-10-CM | POA: Diagnosis not present

## 2024-02-02 DIAGNOSIS — W19XXXA Unspecified fall, initial encounter: Secondary | ICD-10-CM

## 2024-02-02 DIAGNOSIS — R0781 Pleurodynia: Secondary | ICD-10-CM | POA: Insufficient documentation

## 2024-02-12 ENCOUNTER — Other Ambulatory Visit (HOSPITAL_COMMUNITY): Payer: Self-pay | Admitting: Nurse Practitioner

## 2024-02-12 ENCOUNTER — Ambulatory Visit (HOSPITAL_COMMUNITY)
Admission: RE | Admit: 2024-02-12 | Discharge: 2024-02-12 | Disposition: A | Discharge status: Home / Self Care | Payer: Medicare HMO | Source: Ambulatory Visit | Attending: Nurse Practitioner | Admitting: Nurse Practitioner

## 2024-02-12 DIAGNOSIS — J341 Cyst and mucocele of nose and nasal sinus: Secondary | ICD-10-CM | POA: Diagnosis not present

## 2024-02-12 DIAGNOSIS — I6523 Occlusion and stenosis of bilateral carotid arteries: Secondary | ICD-10-CM | POA: Diagnosis not present

## 2024-02-12 DIAGNOSIS — E538 Deficiency of other specified B group vitamins: Secondary | ICD-10-CM | POA: Diagnosis not present

## 2024-02-12 DIAGNOSIS — M1909 Primary osteoarthritis, other specified site: Secondary | ICD-10-CM | POA: Insufficient documentation

## 2024-02-12 DIAGNOSIS — Z043 Encounter for examination and observation following other accident: Secondary | ICD-10-CM | POA: Diagnosis not present

## 2024-02-12 DIAGNOSIS — I672 Cerebral atherosclerosis: Secondary | ICD-10-CM | POA: Diagnosis not present

## 2024-02-12 DIAGNOSIS — W19XXXA Unspecified fall, initial encounter: Secondary | ICD-10-CM | POA: Diagnosis not present

## 2024-02-12 DIAGNOSIS — S0012XA Contusion of left eyelid and periocular area, initial encounter: Secondary | ICD-10-CM | POA: Insufficient documentation

## 2024-02-12 DIAGNOSIS — M1990 Unspecified osteoarthritis, unspecified site: Secondary | ICD-10-CM | POA: Diagnosis not present

## 2024-03-08 DIAGNOSIS — E538 Deficiency of other specified B group vitamins: Secondary | ICD-10-CM | POA: Diagnosis not present

## 2024-03-29 DIAGNOSIS — E118 Type 2 diabetes mellitus with unspecified complications: Secondary | ICD-10-CM | POA: Diagnosis not present

## 2024-03-29 DIAGNOSIS — E782 Mixed hyperlipidemia: Secondary | ICD-10-CM | POA: Diagnosis not present

## 2024-03-29 DIAGNOSIS — E538 Deficiency of other specified B group vitamins: Secondary | ICD-10-CM | POA: Diagnosis not present

## 2024-03-29 DIAGNOSIS — E559 Vitamin D deficiency, unspecified: Secondary | ICD-10-CM | POA: Diagnosis not present

## 2024-04-05 DIAGNOSIS — N1831 Chronic kidney disease, stage 3a: Secondary | ICD-10-CM | POA: Diagnosis not present

## 2024-04-05 DIAGNOSIS — E538 Deficiency of other specified B group vitamins: Secondary | ICD-10-CM | POA: Diagnosis not present

## 2024-04-05 DIAGNOSIS — E1165 Type 2 diabetes mellitus with hyperglycemia: Secondary | ICD-10-CM | POA: Diagnosis not present

## 2024-04-05 DIAGNOSIS — R42 Dizziness and giddiness: Secondary | ICD-10-CM | POA: Diagnosis not present

## 2024-04-05 DIAGNOSIS — I10 Essential (primary) hypertension: Secondary | ICD-10-CM | POA: Diagnosis not present

## 2024-04-05 DIAGNOSIS — E559 Vitamin D deficiency, unspecified: Secondary | ICD-10-CM | POA: Diagnosis not present

## 2024-04-05 DIAGNOSIS — E118 Type 2 diabetes mellitus with unspecified complications: Secondary | ICD-10-CM | POA: Diagnosis not present

## 2024-04-05 DIAGNOSIS — E782 Mixed hyperlipidemia: Secondary | ICD-10-CM | POA: Diagnosis not present

## 2024-04-05 DIAGNOSIS — I129 Hypertensive chronic kidney disease with stage 1 through stage 4 chronic kidney disease, or unspecified chronic kidney disease: Secondary | ICD-10-CM | POA: Diagnosis not present

## 2024-04-08 DIAGNOSIS — E538 Deficiency of other specified B group vitamins: Secondary | ICD-10-CM | POA: Diagnosis not present

## 2024-05-03 DIAGNOSIS — E538 Deficiency of other specified B group vitamins: Secondary | ICD-10-CM | POA: Diagnosis not present

## 2024-05-29 DIAGNOSIS — E538 Deficiency of other specified B group vitamins: Secondary | ICD-10-CM | POA: Diagnosis not present

## 2024-06-04 ENCOUNTER — Ambulatory Visit: Payer: Medicare HMO

## 2024-06-14 ENCOUNTER — Other Ambulatory Visit (HOSPITAL_COMMUNITY): Payer: Self-pay | Admitting: Internal Medicine

## 2024-06-14 DIAGNOSIS — Z1231 Encounter for screening mammogram for malignant neoplasm of breast: Secondary | ICD-10-CM

## 2024-06-28 DIAGNOSIS — M810 Age-related osteoporosis without current pathological fracture: Secondary | ICD-10-CM | POA: Diagnosis not present

## 2024-06-28 DIAGNOSIS — E538 Deficiency of other specified B group vitamins: Secondary | ICD-10-CM | POA: Diagnosis not present

## 2024-07-17 DIAGNOSIS — E782 Mixed hyperlipidemia: Secondary | ICD-10-CM | POA: Diagnosis not present

## 2024-07-17 DIAGNOSIS — E538 Deficiency of other specified B group vitamins: Secondary | ICD-10-CM | POA: Diagnosis not present

## 2024-07-17 DIAGNOSIS — E559 Vitamin D deficiency, unspecified: Secondary | ICD-10-CM | POA: Diagnosis not present

## 2024-07-17 DIAGNOSIS — E118 Type 2 diabetes mellitus with unspecified complications: Secondary | ICD-10-CM | POA: Diagnosis not present

## 2024-07-23 DIAGNOSIS — E1169 Type 2 diabetes mellitus with other specified complication: Secondary | ICD-10-CM | POA: Diagnosis not present

## 2024-07-23 DIAGNOSIS — E1159 Type 2 diabetes mellitus with other circulatory complications: Secondary | ICD-10-CM | POA: Diagnosis not present

## 2024-07-23 DIAGNOSIS — I10 Essential (primary) hypertension: Secondary | ICD-10-CM | POA: Diagnosis not present

## 2024-07-23 DIAGNOSIS — I129 Hypertensive chronic kidney disease with stage 1 through stage 4 chronic kidney disease, or unspecified chronic kidney disease: Secondary | ICD-10-CM | POA: Diagnosis not present

## 2024-07-23 DIAGNOSIS — N1831 Chronic kidney disease, stage 3a: Secondary | ICD-10-CM | POA: Diagnosis not present

## 2024-07-23 DIAGNOSIS — E538 Deficiency of other specified B group vitamins: Secondary | ICD-10-CM | POA: Diagnosis not present

## 2024-07-23 DIAGNOSIS — E782 Mixed hyperlipidemia: Secondary | ICD-10-CM | POA: Diagnosis not present

## 2024-07-23 DIAGNOSIS — E1165 Type 2 diabetes mellitus with hyperglycemia: Secondary | ICD-10-CM | POA: Diagnosis not present

## 2024-07-23 DIAGNOSIS — E118 Type 2 diabetes mellitus with unspecified complications: Secondary | ICD-10-CM | POA: Diagnosis not present

## 2024-07-24 ENCOUNTER — Ambulatory Visit: Attending: Cardiology | Admitting: Cardiology

## 2024-07-24 ENCOUNTER — Encounter: Payer: Self-pay | Admitting: Cardiology

## 2024-07-24 ENCOUNTER — Encounter: Payer: Self-pay | Admitting: Internal Medicine

## 2024-07-24 ENCOUNTER — Encounter (HOSPITAL_COMMUNITY): Payer: Self-pay

## 2024-07-24 ENCOUNTER — Ambulatory Visit (HOSPITAL_COMMUNITY)
Admission: RE | Admit: 2024-07-24 | Discharge: 2024-07-24 | Disposition: A | Discharge status: Home / Self Care | Source: Ambulatory Visit | Attending: Internal Medicine | Admitting: Internal Medicine

## 2024-07-24 VITALS — BP 125/72 | HR 72 | Ht 63.0 in | Wt 180.0 lb

## 2024-07-24 DIAGNOSIS — E782 Mixed hyperlipidemia: Secondary | ICD-10-CM | POA: Diagnosis not present

## 2024-07-24 DIAGNOSIS — I10 Essential (primary) hypertension: Secondary | ICD-10-CM

## 2024-07-24 DIAGNOSIS — Z1231 Encounter for screening mammogram for malignant neoplasm of breast: Secondary | ICD-10-CM | POA: Diagnosis not present

## 2024-07-24 NOTE — Patient Instructions (Signed)
 Medication Instructions:  Continue all current medications.   Labwork: none  Testing/Procedures: none  Follow-Up: 6 months   Any Other Special Instructions Will Be Listed Below (If Applicable).   If you need a refill on your cardiac medications before your next appointment, please call your pharmacy.

## 2024-07-24 NOTE — Progress Notes (Signed)
 Clinical Summary Tina Larson is a 83 y.o.female seen today for follow up of the following medical problems.    1. Dizziness/Vertigo   - episode 10/14 while inside while standing on scale. Immeidately with looking down her vision got altered, swirling white changes. Felt off balance, treid to sit down and fell to floor. Tried to get up but fell back down again. Felt swimmy headed.  - got onto bed, room was spinning, worst with head movement.    - seen in [facility] for dizziness and fall. Treated for vertigo. From pcp notes +dix hall pike maneuvers. Orthostatics negative at pcp office - referred to PT for vertigo exercises   - reports no significant symptoms of dizziness recently.  - has meclizine as needed, has not needed to use   - no recent symptoms   2. HTN  -norvasc  even at low doses caused dizziness, fatigue.   - frequent urniation on higher dose of hydrochlorothiazide   - last visit added aldactone  12.5mg  daily.  - compliant with meds - chronic issues with urinary urgency, frequency.      3. Chest pain - seen by PA Miriam 06/2023 for MSK chest pain. Was positional, tender to palpation - 06/2023 echo: LVEF 65-70%, grade I dd, normal RV function   - left sided pain can occur at rest or with exertion. Pressure like feeling, 5/10 in severity. Can better with deep breaths. Lasts about 5-71min. Occurs 2-3 times per week  - symptoms have improved     4.HLD - compliant with pravastatin  08/2023 TC 167 TG 98 HDL 66 LDL 83 - 07/2024 TC 829 TG 86 HDL 70 LDL 84  5. 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 INTOLERANCE      Current Outpatient Medications  Medication Sig Dispense Refill   Cholecalciferol (VITAMIN D3) 50 MCG (2000 UT) TABS Take 1 tablet by mouth every morning.     cyanocobalamin  (,VITAMIN B-12,) 1000 MCG/ML injection SMARTSIG:1 Milliliter(s) Topical Once a Month     hydrochlorothiazide  (HYDRODIURIL ) 12.5 MG tablet Take 12.5 mg by mouth daily.     meclizine (ANTIVERT) 25 MG tablet Take 25 mg by mouth 3 (three) times daily as needed for dizziness.     Multiple Vitamin (MULTIVITAMIN) tablet Take 1 tablet by mouth every morning.     olmesartan (BENICAR) 40 MG tablet Take 40 mg by mouth every morning.     pravastatin (PRAVACHOL) 40 MG tablet Take 40 mg by mouth every evening.     PROLIA  60 MG/ML SOSY injection Inject 60 mg into the skin every 6 (six) months.     spironolactone  (ALDACTONE ) 25 MG tablet Take 0.5 tablets (12.5 mg total) by mouth daily. 15 tablet 6   Vitamin D, Ergocalciferol, (DRISDOL) 1.25 MG (50000 UNIT) CAPS capsule Take 50,000 Units by mouth once a week. Monday am     No current facility-administered medications for this visit.     Past Surgical History:  Procedure Laterality Date  APPENDECTOMY       Allergies  Allergen Reactions   Shellfish Allergy    Norvasc  [Amlodipine ] Other (See Comments)    fatigue   Tramadol Itching    Patient states that it makes her dizzy and she feels as if she can't walk when she takes this medication so she would rather not take it. INTOLERANCE       Family History  Problem Relation Age of Onset   Cancer Mother        Colon   Heart attack Father    Cancer Father        Lung     Social History Tina Larson reports that she has never smoked. She has never been exposed to tobacco smoke. She has never used smokeless tobacco. Tina Larson reports no history of alcohol use.     Physical Examination Today's Vitals   07/24/24 1015  BP: 125/72  Pulse: 72  SpO2: 97%  Weight: 180 lb  (81.6 kg)  Height: 5' 3 (1.6 m)   Body mass index is 31.89 kg/m.  Gen: resting comfortably, no acute distress HEENT: no scleral icterus, pupils equal round and reactive, no palptable cervical adenopathy,  CV: RRR, no mrg, no jvd Resp: Clear to auscultation bilaterally GI: abdomen is soft, non-tender, non-distended, normal bowel sounds, no hepatosplenomegaly MSK: extremities are warm, no edema.  Skin: warm, no rash Neuro:  no focal deficits Psych: appropriate affect   Diagnostic Studies  06/2023 echo 1. Left ventricular ejection fraction, by estimation, is 65 to 70%. The  left ventricle has normal function. The left ventricle has no regional  wall motion abnormalities. There is mild concentric left ventricular  hypertrophy. Left ventricular diastolic  parameters are consistent with Grade I diastolic dysfunction (impaired  relaxation).   2. Right ventricular systolic function is normal. The right ventricular  size is normal. There is normal pulmonary artery systolic pressure. The  estimated right ventricular systolic pressure is 29.7 mmHg.   3. The mitral valve is grossly normal. Trivial mitral valve  regurgitation.   4. The aortic valve is tricuspid. Aortic valve regurgitation is not  visualized.   5. The inferior vena cava is dilated in size with >50% respiratory  variability, suggesting right atrial pressure of 8 mmHg.    Assessment and Plan   1. HTN - did not tolerate norvasc  after multiple tries - reports frequent urination on higher hydrochlorothiazide  dozing, and to some degree on lower dosing. Discussed likely has some other etiologies for frequency and urgency than from just a low dose of hydrochlorothiazide  and will discuss with pcp, monitor for now. Could change to hydralazine in the future if needed - on highest dose of benicar. Will add aldactone  12.5mg  daily,. Check bmet 2 weeks.    2. HLD - she is at goal, continue current meds      Dorn PHEBE Ross,  M.D.

## 2024-08-23 DIAGNOSIS — E538 Deficiency of other specified B group vitamins: Secondary | ICD-10-CM | POA: Diagnosis not present

## 2024-09-17 DIAGNOSIS — E538 Deficiency of other specified B group vitamins: Secondary | ICD-10-CM | POA: Diagnosis not present

## 2024-09-18 DIAGNOSIS — Z6831 Body mass index (BMI) 31.0-31.9, adult: Secondary | ICD-10-CM | POA: Diagnosis not present

## 2024-09-18 DIAGNOSIS — Z713 Dietary counseling and surveillance: Secondary | ICD-10-CM | POA: Diagnosis not present

## 2024-09-18 DIAGNOSIS — M79604 Pain in right leg: Secondary | ICD-10-CM | POA: Diagnosis not present

## 2024-09-18 DIAGNOSIS — N3941 Urge incontinence: Secondary | ICD-10-CM | POA: Diagnosis not present

## 2024-09-18 DIAGNOSIS — Z79899 Other long term (current) drug therapy: Secondary | ICD-10-CM | POA: Diagnosis not present

## 2024-09-18 DIAGNOSIS — Z7182 Exercise counseling: Secondary | ICD-10-CM | POA: Diagnosis not present

## 2024-09-18 DIAGNOSIS — I10 Essential (primary) hypertension: Secondary | ICD-10-CM | POA: Diagnosis not present

## 2024-09-18 DIAGNOSIS — E669 Obesity, unspecified: Secondary | ICD-10-CM | POA: Diagnosis not present

## 2024-10-03 ENCOUNTER — Encounter: Admitting: Orthopedic Surgery

## 2024-10-03 DIAGNOSIS — M25551 Pain in right hip: Secondary | ICD-10-CM | POA: Diagnosis not present

## 2024-10-24 DIAGNOSIS — E538 Deficiency of other specified B group vitamins: Secondary | ICD-10-CM | POA: Diagnosis not present

## 2024-11-01 ENCOUNTER — Other Ambulatory Visit: Payer: Self-pay | Admitting: Cardiology

## 2024-11-21 ENCOUNTER — Encounter: Payer: Self-pay | Admitting: Internal Medicine
